# Patient Record
Sex: Female | Born: 1945
Health system: Southern US, Community
[De-identification: ages and names within clinical notes are randomized; demographics above are authoritative.]

## PROBLEM LIST (undated history)

## (undated) DIAGNOSIS — R5383 Other fatigue: Secondary | ICD-10-CM

## (undated) DIAGNOSIS — J309 Allergic rhinitis, unspecified: Secondary | ICD-10-CM

## (undated) DIAGNOSIS — Z8679 Personal history of other diseases of the circulatory system: Secondary | ICD-10-CM

## (undated) DIAGNOSIS — F419 Anxiety disorder, unspecified: Secondary | ICD-10-CM

## (undated) DIAGNOSIS — E7800 Pure hypercholesterolemia, unspecified: Secondary | ICD-10-CM

## (undated) DIAGNOSIS — K0510 Chronic gingivitis, plaque induced: Secondary | ICD-10-CM

## (undated) DIAGNOSIS — R0602 Shortness of breath: Secondary | ICD-10-CM

## (undated) DIAGNOSIS — N951 Menopausal and female climacteric states: Secondary | ICD-10-CM

## (undated) DIAGNOSIS — G4700 Insomnia, unspecified: Secondary | ICD-10-CM

## (undated) DIAGNOSIS — E039 Hypothyroidism, unspecified: Secondary | ICD-10-CM

## (undated) DIAGNOSIS — I7300 Raynaud's syndrome without gangrene: Secondary | ICD-10-CM

## (undated) DIAGNOSIS — M533 Sacrococcygeal disorders, not elsewhere classified: Secondary | ICD-10-CM

## (undated) DIAGNOSIS — E559 Vitamin D deficiency, unspecified: Secondary | ICD-10-CM

## (undated) DIAGNOSIS — Z91038 Other insect allergy status: Secondary | ICD-10-CM

## (undated) DIAGNOSIS — Z6823 Body mass index (BMI) 23.0-23.9, adult: Secondary | ICD-10-CM

## (undated) DIAGNOSIS — E785 Hyperlipidemia, unspecified: Secondary | ICD-10-CM

## (undated) DIAGNOSIS — N952 Postmenopausal atrophic vaginitis: Secondary | ICD-10-CM

## (undated) DIAGNOSIS — I10 Essential (primary) hypertension: Secondary | ICD-10-CM

## (undated) DIAGNOSIS — Z91030 Bee allergy status: Secondary | ICD-10-CM

## (undated) DIAGNOSIS — Z889 Allergy status to unspecified drugs, medicaments and biological substances status: Secondary | ICD-10-CM

## (undated) HISTORY — DX: Essential (primary) hypertension: I10

## (undated) HISTORY — DX: Chronic gingivitis, plaque induced: K05.10

## (undated) HISTORY — DX: Other fatigue: R53.83

## (undated) HISTORY — DX: Vitamin D deficiency, unspecified: E55.9

## (undated) HISTORY — DX: Hypothyroidism, unspecified: E03.9

## (undated) HISTORY — DX: Pure hypercholesterolemia, unspecified: E78.00

## (undated) HISTORY — DX: Allergic rhinitis, unspecified: J30.9

## (undated) HISTORY — PX: ADENOIDECTOMY: SUR15

## (undated) HISTORY — DX: Hyperlipidemia, unspecified: E78.5

## (undated) HISTORY — DX: Shortness of breath: R06.02

## (undated) HISTORY — DX: Other insect allergy status: Z91.038

## (undated) HISTORY — DX: Allergy status to unspecified drugs, medicaments and biological substances: Z88.9

## (undated) HISTORY — DX: Menopausal and female climacteric states: N95.1

## (undated) HISTORY — PX: ABDOMINAL HYSTERECTOMY: SUR658

## (undated) HISTORY — PX: OTHER SURGICAL HISTORY: SHX169

## (undated) HISTORY — DX: Insomnia, unspecified: G47.00

## (undated) HISTORY — PX: REFRACTIVE SURGERY: SHX103

## (undated) HISTORY — DX: Body mass index (BMI) 23.0-23.9, adult: Z68.23

## (undated) HISTORY — DX: Postmenopausal atrophic vaginitis: N95.2

## (undated) HISTORY — DX: Personal history of other diseases of the circulatory system: Z86.79

## (undated) HISTORY — DX: Raynaud's syndrome without gangrene: I73.00

## (undated) HISTORY — DX: Sacrococcygeal disorders, not elsewhere classified: M53.3

## (undated) HISTORY — DX: Anxiety disorder, unspecified: F41.9

## (undated) HISTORY — PX: LAPAROSCOPY: SHX197

## (undated) HISTORY — PX: TONSILLECTOMY: SUR1361

---
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
## 1998-09-10 ENCOUNTER — Ambulatory Visit (HOSPITAL_COMMUNITY): Admission: RE | Admit: 1998-09-10 | Discharge: 1998-09-10 | Payer: Self-pay | Admitting: Orthopedic Surgery

## 1998-09-10 ENCOUNTER — Encounter: Payer: Self-pay | Admitting: Orthopedic Surgery

## 1998-11-05 ENCOUNTER — Ambulatory Visit (HOSPITAL_BASED_OUTPATIENT_CLINIC_OR_DEPARTMENT_OTHER): Admission: RE | Admit: 1998-11-05 | Discharge: 1998-11-05 | Payer: Self-pay | Admitting: Orthopedic Surgery

## 1999-01-16 ENCOUNTER — Ambulatory Visit (HOSPITAL_COMMUNITY): Admission: RE | Admit: 1999-01-16 | Discharge: 1999-01-16 | Payer: Self-pay

## 1999-05-12 ENCOUNTER — Other Ambulatory Visit: Admission: RE | Admit: 1999-05-12 | Discharge: 1999-05-12 | Payer: Self-pay | Admitting: Obstetrics and Gynecology

## 2000-06-06 ENCOUNTER — Other Ambulatory Visit: Admission: RE | Admit: 2000-06-06 | Discharge: 2000-06-06 | Payer: Self-pay | Admitting: Obstetrics and Gynecology

## 2001-09-27 ENCOUNTER — Encounter: Admission: RE | Admit: 2001-09-27 | Discharge: 2001-09-27 | Payer: Self-pay | Admitting: Surgery

## 2001-09-27 ENCOUNTER — Encounter: Payer: Self-pay | Admitting: Surgery

## 2002-03-28 ENCOUNTER — Encounter: Payer: Self-pay | Admitting: Internal Medicine

## 2002-03-28 ENCOUNTER — Encounter: Admission: RE | Admit: 2002-03-28 | Discharge: 2002-03-28 | Payer: Self-pay | Admitting: Internal Medicine

## 2002-12-26 ENCOUNTER — Encounter: Payer: Self-pay | Admitting: Internal Medicine

## 2002-12-26 ENCOUNTER — Encounter: Admission: RE | Admit: 2002-12-26 | Discharge: 2002-12-26 | Payer: Self-pay | Admitting: Internal Medicine

## 2005-12-08 ENCOUNTER — Other Ambulatory Visit: Admission: RE | Admit: 2005-12-08 | Discharge: 2005-12-08 | Payer: Self-pay | Admitting: Anesthesiology

## 2017-03-28 DIAGNOSIS — N952 Postmenopausal atrophic vaginitis: Secondary | ICD-10-CM | POA: Diagnosis not present

## 2017-03-28 DIAGNOSIS — Z6822 Body mass index (BMI) 22.0-22.9, adult: Secondary | ICD-10-CM | POA: Diagnosis not present

## 2017-03-28 DIAGNOSIS — E785 Hyperlipidemia, unspecified: Secondary | ICD-10-CM | POA: Diagnosis not present

## 2017-03-28 DIAGNOSIS — Z79899 Other long term (current) drug therapy: Secondary | ICD-10-CM | POA: Diagnosis not present

## 2017-03-28 DIAGNOSIS — J309 Allergic rhinitis, unspecified: Secondary | ICD-10-CM | POA: Diagnosis not present

## 2017-03-28 DIAGNOSIS — E039 Hypothyroidism, unspecified: Secondary | ICD-10-CM | POA: Diagnosis not present

## 2017-03-28 DIAGNOSIS — N3281 Overactive bladder: Secondary | ICD-10-CM | POA: Diagnosis not present

## 2017-03-28 DIAGNOSIS — Z9181 History of falling: Secondary | ICD-10-CM | POA: Diagnosis not present

## 2017-03-28 DIAGNOSIS — Z1389 Encounter for screening for other disorder: Secondary | ICD-10-CM | POA: Diagnosis not present

## 2017-03-28 DIAGNOSIS — E559 Vitamin D deficiency, unspecified: Secondary | ICD-10-CM | POA: Diagnosis not present

## 2017-03-28 DIAGNOSIS — I10 Essential (primary) hypertension: Secondary | ICD-10-CM | POA: Diagnosis not present

## 2017-03-28 DIAGNOSIS — Z889 Allergy status to unspecified drugs, medicaments and biological substances status: Secondary | ICD-10-CM | POA: Diagnosis not present

## 2017-04-14 DIAGNOSIS — H43811 Vitreous degeneration, right eye: Secondary | ICD-10-CM | POA: Diagnosis not present

## 2017-04-17 DIAGNOSIS — S61012A Laceration without foreign body of left thumb without damage to nail, initial encounter: Secondary | ICD-10-CM | POA: Diagnosis not present

## 2017-04-24 DIAGNOSIS — H43813 Vitreous degeneration, bilateral: Secondary | ICD-10-CM | POA: Diagnosis not present

## 2017-11-22 DIAGNOSIS — E559 Vitamin D deficiency, unspecified: Secondary | ICD-10-CM | POA: Diagnosis not present

## 2017-11-22 DIAGNOSIS — Z6824 Body mass index (BMI) 24.0-24.9, adult: Secondary | ICD-10-CM | POA: Diagnosis not present

## 2017-11-22 DIAGNOSIS — F419 Anxiety disorder, unspecified: Secondary | ICD-10-CM | POA: Diagnosis not present

## 2017-11-22 DIAGNOSIS — E785 Hyperlipidemia, unspecified: Secondary | ICD-10-CM | POA: Diagnosis not present

## 2017-11-22 DIAGNOSIS — Z79899 Other long term (current) drug therapy: Secondary | ICD-10-CM | POA: Diagnosis not present

## 2017-11-22 DIAGNOSIS — I10 Essential (primary) hypertension: Secondary | ICD-10-CM | POA: Diagnosis not present

## 2017-11-22 DIAGNOSIS — N959 Unspecified menopausal and perimenopausal disorder: Secondary | ICD-10-CM | POA: Diagnosis not present

## 2017-11-22 DIAGNOSIS — E039 Hypothyroidism, unspecified: Secondary | ICD-10-CM | POA: Diagnosis not present

## 2017-11-22 DIAGNOSIS — J309 Allergic rhinitis, unspecified: Secondary | ICD-10-CM | POA: Diagnosis not present

## 2017-12-13 DIAGNOSIS — I7300 Raynaud's syndrome without gangrene: Secondary | ICD-10-CM | POA: Insufficient documentation

## 2017-12-13 DIAGNOSIS — T691XXD Chilblains, subsequent encounter: Secondary | ICD-10-CM

## 2017-12-13 HISTORY — DX: Raynaud's syndrome without gangrene: I73.00

## 2017-12-13 HISTORY — DX: Chilblains, subsequent encounter: T69.1XXD

## 2018-04-09 DIAGNOSIS — F419 Anxiety disorder, unspecified: Secondary | ICD-10-CM | POA: Diagnosis not present

## 2018-04-09 DIAGNOSIS — Z1231 Encounter for screening mammogram for malignant neoplasm of breast: Secondary | ICD-10-CM | POA: Diagnosis not present

## 2018-04-09 DIAGNOSIS — Z6824 Body mass index (BMI) 24.0-24.9, adult: Secondary | ICD-10-CM | POA: Diagnosis not present

## 2018-04-09 DIAGNOSIS — M542 Cervicalgia: Secondary | ICD-10-CM | POA: Diagnosis not present

## 2018-05-03 DIAGNOSIS — H43813 Vitreous degeneration, bilateral: Secondary | ICD-10-CM | POA: Diagnosis not present

## 2018-06-25 DIAGNOSIS — Z6824 Body mass index (BMI) 24.0-24.9, adult: Secondary | ICD-10-CM | POA: Diagnosis not present

## 2018-06-25 DIAGNOSIS — E039 Hypothyroidism, unspecified: Secondary | ICD-10-CM | POA: Diagnosis not present

## 2018-06-25 DIAGNOSIS — I10 Essential (primary) hypertension: Secondary | ICD-10-CM | POA: Diagnosis not present

## 2018-06-25 DIAGNOSIS — Z79899 Other long term (current) drug therapy: Secondary | ICD-10-CM | POA: Diagnosis not present

## 2018-06-25 DIAGNOSIS — M533 Sacrococcygeal disorders, not elsewhere classified: Secondary | ICD-10-CM | POA: Diagnosis not present

## 2018-06-25 DIAGNOSIS — E559 Vitamin D deficiency, unspecified: Secondary | ICD-10-CM | POA: Diagnosis not present

## 2018-06-25 DIAGNOSIS — E785 Hyperlipidemia, unspecified: Secondary | ICD-10-CM | POA: Diagnosis not present

## 2018-09-30 DIAGNOSIS — M546 Pain in thoracic spine: Secondary | ICD-10-CM | POA: Diagnosis not present

## 2018-09-30 DIAGNOSIS — Z6824 Body mass index (BMI) 24.0-24.9, adult: Secondary | ICD-10-CM | POA: Diagnosis not present

## 2018-10-16 DIAGNOSIS — Z6823 Body mass index (BMI) 23.0-23.9, adult: Secondary | ICD-10-CM | POA: Diagnosis not present

## 2018-10-16 DIAGNOSIS — I10 Essential (primary) hypertension: Secondary | ICD-10-CM | POA: Diagnosis not present

## 2018-10-16 DIAGNOSIS — Z1231 Encounter for screening mammogram for malignant neoplasm of breast: Secondary | ICD-10-CM | POA: Diagnosis not present

## 2018-10-16 DIAGNOSIS — Z1331 Encounter for screening for depression: Secondary | ICD-10-CM | POA: Diagnosis not present

## 2018-10-16 DIAGNOSIS — Z9181 History of falling: Secondary | ICD-10-CM | POA: Diagnosis not present

## 2018-10-16 DIAGNOSIS — E785 Hyperlipidemia, unspecified: Secondary | ICD-10-CM | POA: Diagnosis not present

## 2018-10-23 DIAGNOSIS — M5431 Sciatica, right side: Secondary | ICD-10-CM | POA: Diagnosis not present

## 2018-10-23 DIAGNOSIS — M545 Low back pain: Secondary | ICD-10-CM | POA: Diagnosis not present

## 2019-03-27 DIAGNOSIS — I10 Essential (primary) hypertension: Secondary | ICD-10-CM | POA: Diagnosis not present

## 2019-03-27 DIAGNOSIS — E785 Hyperlipidemia, unspecified: Secondary | ICD-10-CM | POA: Diagnosis not present

## 2019-03-27 DIAGNOSIS — G4700 Insomnia, unspecified: Secondary | ICD-10-CM | POA: Diagnosis not present

## 2019-03-27 DIAGNOSIS — M533 Sacrococcygeal disorders, not elsewhere classified: Secondary | ICD-10-CM | POA: Diagnosis not present

## 2019-03-28 DIAGNOSIS — E785 Hyperlipidemia, unspecified: Secondary | ICD-10-CM | POA: Diagnosis not present

## 2019-03-28 DIAGNOSIS — E039 Hypothyroidism, unspecified: Secondary | ICD-10-CM | POA: Diagnosis not present

## 2019-03-28 DIAGNOSIS — E559 Vitamin D deficiency, unspecified: Secondary | ICD-10-CM | POA: Diagnosis not present

## 2019-03-28 DIAGNOSIS — Z79899 Other long term (current) drug therapy: Secondary | ICD-10-CM | POA: Diagnosis not present

## 2019-05-12 DIAGNOSIS — Z889 Allergy status to unspecified drugs, medicaments and biological substances status: Secondary | ICD-10-CM | POA: Diagnosis not present

## 2019-05-12 DIAGNOSIS — G4700 Insomnia, unspecified: Secondary | ICD-10-CM | POA: Diagnosis not present

## 2019-05-12 DIAGNOSIS — Z6824 Body mass index (BMI) 24.0-24.9, adult: Secondary | ICD-10-CM | POA: Diagnosis not present

## 2019-05-12 DIAGNOSIS — Z91030 Bee allergy status: Secondary | ICD-10-CM | POA: Diagnosis not present

## 2019-06-02 ENCOUNTER — Ambulatory Visit: Payer: Self-pay | Admitting: Allergy and Immunology

## 2019-06-09 ENCOUNTER — Other Ambulatory Visit: Payer: Self-pay

## 2019-06-09 ENCOUNTER — Ambulatory Visit (INDEPENDENT_AMBULATORY_CARE_PROVIDER_SITE_OTHER): Payer: Medicare Other | Admitting: Allergy and Immunology

## 2019-06-09 ENCOUNTER — Encounter: Payer: Self-pay | Admitting: Allergy and Immunology

## 2019-06-09 VITALS — BP 112/72 | HR 68 | Temp 98.3°F | Resp 14 | Ht 62.5 in | Wt 138.6 lb

## 2019-06-09 DIAGNOSIS — K219 Gastro-esophageal reflux disease without esophagitis: Secondary | ICD-10-CM | POA: Diagnosis not present

## 2019-06-09 DIAGNOSIS — J3089 Other allergic rhinitis: Secondary | ICD-10-CM | POA: Diagnosis not present

## 2019-06-09 DIAGNOSIS — T782XXD Anaphylactic shock, unspecified, subsequent encounter: Secondary | ICD-10-CM

## 2019-06-09 DIAGNOSIS — H6981 Other specified disorders of Eustachian tube, right ear: Secondary | ICD-10-CM | POA: Diagnosis not present

## 2019-06-09 DIAGNOSIS — T63481D Toxic effect of venom of other arthropod, accidental (unintentional), subsequent encounter: Secondary | ICD-10-CM

## 2019-06-09 MED ORDER — OMEPRAZOLE 40 MG PO CPDR: 40.0000 mg | DELAYED_RELEASE_CAPSULE | ORAL | 5 refills | Status: DC

## 2019-06-09 MED ORDER — FAMOTIDINE 40 MG PO TABS: 40.0000 mg | ORAL_TABLET | Freq: Every day | ORAL | 5 refills | Status: DC

## 2019-06-09 MED ORDER — EPINEPHRINE 0.3 MG/0.3ML IJ SOAJ: INTRAMUSCULAR | 3 refills | Status: DC

## 2019-06-09 NOTE — Patient Instructions (Addendum)
  1.  Allergen avoidance measures?  2.  Blood -fire ant IgE, hymenoptera venom panel, tryptase  3.  EpiPen, Benadryl, MD/ER evaluation for allergic reaction  4.  Continue to treat allergies and ear issue:   A.  Montelukast 10 mg - 1 tablet daily  B.  Nasal fluticasone - 1-2 sprays each nostril daily  C.  OTC antihistamine if needed  5.  Treat LPR:   A.  Slowly consolidate caffeine consumption  B.  Omeprazole 40 mg tablet in a.m.  C.  Famotidine 40 mg tablet in p.m.  D.  Replace throat clearing with swallowing maneuver  6.  Immunotherapy?  7.  Visit with ENT?  8.  Return to clinic in 4 weeks or earlier if problem

## 2019-06-09 NOTE — Progress Notes (Signed)
[place]   Dear Dr. Mathis BudUppin,  Thank you for referring Allison Zavala to the [facility] [place] on 06/09/2019.   Below is a summation of this patient's evaluation and recommendations.  Thank you for your referral. I will keep you informed about this patient's response to treatment.   If you have any questions please do not hesitate to contact me.   Sincerely,  Jessica PriestEric J. Abisai Coble, MD Allergy / Immunology [facility]    ______________________________________________________________________    NEW PATIENT NOTE  Referring Provider: Lucianne LeiUppin, Nina, MD Primary Provider: Lucianne LeiUppin, Nina, MD Date of office visit: 06/09/2019    Subjective:   Chief Complaint:  Allison JimSherre E Couse (DOB: 1946-05-13) is a 73 y.o. female who presents to the clinic on 06/09/2019 with a chief complaint of Allergic Reaction .     HPI: Allison Zavala presents to this clinic in evaluation of several issues.  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  She had a very interesting history of recurrent reactions.  Apparently after being stung by multiple hymenoptera and having her anaphylactic reaction as a result of that exposure she had intermittent anaphylaxis secondary to exposures unrelated to hymenoptera venom.  Apparently this went on for about 2 years or so.  She [facility] evaluation [facility] and apparently her diagnostic evaluation only identified hymenoptera venom hypersensitivity state.  Fortunately, after about 2 years of increased immune system activity she resolve this issue of recurrent anaphylaxis and for 7 years did not have any issues.  Second, 6 weeks ago she was stung multiple times by some identifiable insect under her left arm while working in the  garden.  She noticed red spots and pain and she went inside and took Benadryl.  This was around 3 PM in the afternoon.  At around 11 PM she developed global pruritus and throat constriction and facial swelling for which she used an EpiPen which resolved this issue within about 60 minutes.  She had no associated GI or nasal symptoms and did not have any hives.  Third, she has a history of right ear fullness and achiness and some nasal congestion for which he uses montelukast and Flonase which helps this issue significantly.  Fourth, she has constant throat clearing for the past year or two.  She must take NyQuil at night to stop her throat clearing and this is not very effective in alleviating this issue.  Montelukast and Flonase does not help this issue.  She throat clears because she feels something stuck in her throat.  She does have intermittent regurgitation and heartburn for which she will take Tums about once a week or once every 2 weeks.  She does drink 12 cups of very strong coffee per day.  Past Medical History:  Diagnosis Date  . High cholesterol   . Hypothyroidism     Past Surgical History:  Procedure Laterality Date  . ABDOMINAL HYSTERECTOMY    . ADENOIDECTOMY    . LAPAROSCOPY     X 3  . OTHER SURGICAL HISTORY     Pelvic repairs abdominal X 2  . REFRACTIVE SURGERY    . TONSILLECTOMY      Allergies as of 06/09/2019      Reactions   Demerol [meperidine Hcl] Nausea And Vomiting  Medication List      atorvastatin 80 MG tablet Commonly known as: LIPITOR TK 1 T PO HS   EPINEPHrine 0.3 mg/0.3 mL Soaj injection Commonly known as: EPI-PEN INJECT INTRAMUSCULARLY AS DIRECTED   estradiol 0.5 MG tablet Commonly known as: ESTRACE TK 1 T PO QD   fluticasone 50 MCG/ACT nasal spray Commonly known as: FLONASE USE 2 SPRAYS IN EACH NOSTRIL EVERY DAY   levothyroxine 75 MCG tablet Commonly known as: SYNTHROID TK 1 T PO D   montelukast 10 MG tablet Commonly known as:  SINGULAIR TK 1 T PO QD   sertraline 100 MG tablet Commonly known as: ZOLOFT TK 1 T PO QD       Review of systems negative except as noted in HPI / PMHx or noted below:  Review of Systems  Constitutional: Negative.   HENT: Negative.   Eyes: Negative.   Respiratory: Negative.   Cardiovascular: Negative.   Gastrointestinal: Negative.   Genitourinary: Negative.   Musculoskeletal: Negative.   Skin: Negative.   Neurological: Negative.   Endo/Heme/Allergies: Negative.   Psychiatric/Behavioral: Negative.     Family History  Problem Relation Age of Onset  . Stroke Mother   . Alzheimer's disease Mother   . Lung cancer Father     Social History   Socioeconomic History  . Marital status: Divorced    Spouse name: Not on file  . Number of children: Not on file  . Years of education: Not on file  . Highest education level: Not on file  Occupational History  . Not on file  Social Needs  . Financial resource strain: Not on file  . Food insecurity    Worry: Not on file    Inability: Not on file  . Transportation needs    Medical: Not on file    Non-medical: Not on file  Tobacco Use  . Smoking status: Never Smoker  . Smokeless tobacco: Never Used  Substance and Sexual Activity  . Alcohol use: Yes  . Drug use: Never  . Sexual activity: Not on file  Lifestyle  . Physical activity    Days per week: Not on file    Minutes per session: Not on file  . Stress: Not on file  Relationships  . Social Musicianconnections    Talks on phone: Not on file    Gets together: Not on file    Attends religious service: Not on file    Active member of club or organization: Not on file    Attends meetings of clubs or organizations: Not on file    Relationship status: Not on file  . Intimate partner violence    Fear of current or ex partner: Not on file    Emotionally abused: Not on file    Physically abused: Not on file    Forced sexual activity: Not on file  Other Topics Concern  . Not  on file  Social History Narrative  . Not on file    Environmental and Social history  Lives in a house with a dry environment, a dog located inside the household, wood in the bedroom, plastic on the bed, plastic on the pillow, no smokers located to the household.  She is an Network engineerinterior designer.  Objective:   Vitals:   06/09/19 1348  BP: 112/72  Pulse: 68  Resp: 14  Temp: 98.3 F (36.8 C)  SpO2: 96%   Height: 5' 2.5" (158.8 cm) Weight: 138 lb 9.6 oz (62.9 kg)  Physical Exam Constitutional:  Appearance: She is not diaphoretic.  HENT:     Head: Normocephalic. No right periorbital erythema or left periorbital erythema.     Right Ear: Ear canal and external ear normal. There is impacted cerumen.     Left Ear: Ear canal and external ear normal. There is impacted cerumen.     Nose: Nose normal. No mucosal edema or rhinorrhea.     Mouth/Throat:     Pharynx: Uvula midline. No oropharyngeal exudate.  Eyes:     General: Lids are normal.     Conjunctiva/sclera: Conjunctivae normal.     Pupils: Pupils are equal, round, and reactive to light.  Neck:     Thyroid: No thyromegaly.     Trachea: Trachea normal. No tracheal tenderness or tracheal deviation.  Cardiovascular:     Rate and Rhythm: Normal rate and regular rhythm.     Heart sounds: Normal heart sounds, S1 normal and S2 normal. No murmur.  Pulmonary:     Effort: Pulmonary effort is normal. No respiratory distress.     Breath sounds: Normal breath sounds. No stridor. No wheezing or rales.  Chest:     Chest wall: No tenderness.  Abdominal:     General: There is no distension.     Palpations: Abdomen is soft. There is no mass.     Tenderness: There is no abdominal tenderness. There is no guarding or rebound.  Musculoskeletal:        General: No tenderness.  Lymphadenopathy:     Head:     Right side of head: No tonsillar adenopathy.     Left side of head: No tonsillar adenopathy.     Cervical: No cervical adenopathy.   Skin:    Coloration: Skin is not pale.     Findings: No erythema or rash.     Nails: There is no clubbing.   Neurological:     Mental Status: She is alert.     Diagnostics: Allergy skin tests were not performed.   Assessment and Plan:    1. Anaphylaxis due to hymenoptera venom, accidental or unintentional, subsequent encounter   2. Perennial allergic rhinitis   3. ETD (Eustachian tube dysfunction), right   4. LPRD (laryngopharyngeal reflux disease)     1.  Allergen avoidance measures?  2.  Blood -fire ant IgE, hymenoptera venom panel, tryptase  3.  EpiPen, Benadryl, MD/ER evaluation for allergic reaction  4.  Continue to treat allergies and ear issue:   A.  Montelukast 10 mg - 1 tablet daily  B.  Nasal fluticasone - 1-2 sprays each nostril daily  C.  OTC antihistamine if needed  5.  Treat LPR:   A.  Slowly consolidate caffeine consumption  B.  Omeprazole 40 mg tablet in a.m.  C.  Famotidine 40 mg tablet in p.m.  D.  Replace throat clearing with swallowing maneuver  6.  Immunotherapy?  7.  Visit with ENT?  8.  Return to clinic in 4 weeks or earlier if problem  Hanadi will obtain blood tests in investigation of her recent episode anaphylaxis which may have been precipitated by some form of hymenoptera venom exposure.  As well, given her history of atypical anaphylaxis with unknown trigger that appeared to be a significant issue years ago we will check a tryptase level to see if she has a problem with mast cell burden.  Although her montelukast and Flonase has helped her ETD and allergies it has not helped her constant throat clearing which is probably a  manifestation of LPR.  We will give her a 4-week trial on the therapy noted above to address this issue.  If this does not improve with this treatment then we need to have her throat evaluated by ENT.  I will contact her with the results of her blood test once they are available for review.  Jiles Prows, MD Allergy /  Immunology [facility]

## 2019-06-10 ENCOUNTER — Encounter: Payer: Self-pay | Admitting: Allergy and Immunology

## 2019-06-13 LAB — ALLERGEN HYMENOPTERA PANEL
Bumblebee: <0.1 kU/L
Honeybee IgE: 0.1 kU/L — AB
Hornet, White Face, IgE: 3 kU/L — AB
Hornet, Yellow, IgE: 2.32 kU/L — AB
Paper Wasp IgE: 33 kU/L — AB
Yellow Jacket, IgE: 12.6 kU/L — AB

## 2019-06-13 LAB — ALLERGEN FIRE ANT: I070-IgE Fire Ant (Invicta): 0.2 kU/L — AB

## 2019-06-13 LAB — TRYPTASE: Tryptase: 6.8 ug/L (ref 2.2–13.2)

## 2019-06-26 DIAGNOSIS — K219 Gastro-esophageal reflux disease without esophagitis: Secondary | ICD-10-CM | POA: Diagnosis not present

## 2019-06-26 DIAGNOSIS — J309 Allergic rhinitis, unspecified: Secondary | ICD-10-CM | POA: Diagnosis not present

## 2019-06-26 DIAGNOSIS — Z6824 Body mass index (BMI) 24.0-24.9, adult: Secondary | ICD-10-CM | POA: Diagnosis not present

## 2019-06-26 DIAGNOSIS — G4700 Insomnia, unspecified: Secondary | ICD-10-CM | POA: Diagnosis not present

## 2019-07-16 ENCOUNTER — Encounter: Payer: Self-pay | Admitting: Allergy and Immunology

## 2019-07-16 ENCOUNTER — Other Ambulatory Visit: Payer: Self-pay

## 2019-07-16 ENCOUNTER — Ambulatory Visit (INDEPENDENT_AMBULATORY_CARE_PROVIDER_SITE_OTHER): Payer: Medicare Other | Admitting: Allergy and Immunology

## 2019-07-16 VITALS — BP 132/84 | HR 63 | Temp 98.4°F | Resp 16

## 2019-07-16 DIAGNOSIS — T782XXD Anaphylactic shock, unspecified, subsequent encounter: Secondary | ICD-10-CM | POA: Diagnosis not present

## 2019-07-16 DIAGNOSIS — K219 Gastro-esophageal reflux disease without esophagitis: Secondary | ICD-10-CM | POA: Diagnosis not present

## 2019-07-16 DIAGNOSIS — H6981 Other specified disorders of Eustachian tube, right ear: Secondary | ICD-10-CM | POA: Diagnosis not present

## 2019-07-16 DIAGNOSIS — J3089 Other allergic rhinitis: Secondary | ICD-10-CM | POA: Diagnosis not present

## 2019-07-16 DIAGNOSIS — T63481D Toxic effect of venom of other arthropod, accidental (unintentional), subsequent encounter: Secondary | ICD-10-CM | POA: Diagnosis not present

## 2019-07-16 NOTE — Patient Instructions (Addendum)
  1.  Consider a course of immunotherapy directed against hymenoptera venom  2. EpiPen, Benadryl, MD/ER evaluation for allergic reaction  3.  Continue to treat allergies and ear issue:   A.  Montelukast 10 mg - 1 tablet daily  B.  Nasal fluticasone - 1-2 sprays each nostril daily  C.  OTC antihistamine if needed  4.  Treat LPR:   A.  Slowly consolidate caffeine consumption  B.  Omeprazole 40 mg tablet in a.m.  C.  Famotidine 40 mg tablet in p.m.  D.  Replace throat clearing with swallowing maneuver  5.  Visit with ENT for ETD  6.  Return to clinic in 8 weeks or earlier if problem  7. Obtain fall flu vaccine (and COVID vaccine)

## 2019-07-16 NOTE — Progress Notes (Signed)
[name]   Follow-up Note  Referring Provider: Lucianne LeiUppin, Nina, MD Primary Provider: Lucianne LeiUppin, Nina, MD Date of Office Visit: 07/16/2019  Subjective:   Allison Zavala (DOB: Feb 12, 1946) is a 73 y.o. female who [facility] 07[facility]/2019 in re-evaluation of the following:  HPI: [name] returns to this clinic in evaluation of allergic rhinitis, ETD, LPR, and history of hymenoptera venom hypersensitivity state.  I last saw her in this clinic during her initial evaluation of 09 June 2019.  She has significantly improved regarding all of her throat issues.  She is 85% improved regarding her constant throat clearing and feeling as though something is in her throat.  All of her heartburn has resolved.  She is down to drinking 6 cups of half caffeinated coffee per day and has been consistently using her PPI and H2 receptor blocker.  Her nose is doing quite well at this point in time on a nasal steroid and montelukast.  She still has ear fullness especially on the right.  There is really been no change regarding this issue.  She carries an EpiPen for her hymenoptera venom hypersensitivity state.  Allergies as of 07/16/2019      Reactions   Demerol [meperidine Hcl] Nausea And Vomiting      Medication List      atorvastatin 80 MG tablet Commonly known as: LIPITOR TK 1 T PO HS   baclofen 10 MG tablet Commonly known as: LIORESAL TK 1 T PO D PRN   EPINEPHrine 0.3 mg/0.3 mL Soaj injection Commonly known as: EPI-PEN Use as directed for life-threatening allergic reaction.   estradiol 0.5 MG tablet Commonly known as: ESTRACE TK 1 T PO QD   famotidine 40 MG tablet Commonly known as: PEPCID Take 1 tablet (40 mg total) by mouth at bedtime.   fluticasone 50 MCG/ACT nasal spray Commonly known as: FLONASE USE 2 SPRAYS IN EACH NOSTRIL EVERY DAY   gabapentin 100 MG capsule Commonly known as: NEURONTIN TK 1 C PO HS  FOR 1 WEEK THEN 1 C PO BID THEN TID   levothyroxine 75 MCG tablet Commonly known as: SYNTHROID TK 1 T PO D   montelukast 10 MG tablet Commonly known as: SINGULAIR TK 1 T PO QD   omeprazole 40 MG capsule Commonly known as: PRILOSEC Take 1 capsule (40 mg total) by mouth every morning.   sertraline 100 MG tablet Commonly known as: ZOLOFT TK 1 T PO QD   zolpidem 10 MG tablet Commonly known as: AMBIEN TK 1 T PO HS       Past Medical History:  Diagnosis Date  . High cholesterol   . Hypothyroidism     Past Surgical History:  Procedure Laterality Date  . ABDOMINAL HYSTERECTOMY    . ADENOIDECTOMY    . LAPAROSCOPY     X 3  . OTHER SURGICAL HISTORY     Pelvic repairs abdominal X 2  . REFRACTIVE SURGERY    . TONSILLECTOMY      Review of systems negative except as noted in HPI / PMHx or noted below:  Review of Systems  Constitutional: Negative.   HENT: Negative.   Eyes: Negative.   Respiratory: Negative.   Cardiovascular: Negative.   Gastrointestinal: Negative.   Genitourinary: Negative.   Musculoskeletal: Negative.   Skin: Negative.   Neurological: Negative.   Endo/Heme/Allergies: Negative.   Psychiatric/Behavioral: Negative.      Objective:   Vitals:  07/16/19 1128  BP: 132/84  Pulse: 63  Resp: 16  Temp: 98.4 F (36.9 C)  SpO2: 98%          Physical Exam Constitutional:      Appearance: She is not diaphoretic.  HENT:     Head: Normocephalic.     Right Ear: Tympanic membrane, ear canal and external ear normal.     Left Ear: Tympanic membrane, ear canal and external ear normal.     Nose: Nose normal. No mucosal edema or rhinorrhea.     Mouth/Throat:     Pharynx: Uvula midline. No oropharyngeal exudate.  Eyes:     Conjunctiva/sclera: Conjunctivae normal.  Neck:     Thyroid: No thyromegaly.     Trachea: Trachea normal. No tracheal tenderness or tracheal deviation.  Cardiovascular:     Rate and Rhythm: Normal rate and regular rhythm.      Heart sounds: Normal heart sounds, S1 normal and S2 normal. No murmur.  Pulmonary:     Effort: No respiratory distress.     Breath sounds: Normal breath sounds. No stridor. No wheezing or rales.  Lymphadenopathy:     Head:     Right side of head: No tonsillar adenopathy.     Left side of head: No tonsillar adenopathy.     Cervical: No cervical adenopathy.  Skin:    Findings: No erythema or rash.     Nails: There is no clubbing.   Neurological:     Mental Status: She is alert.     Diagnostics:    Results of blood tests obtained 17 June 2019 identified fire ant IgE 0.20 KU/L, white faced hornet 3.0 KU/L, yellowjacket 12.60 KU/L, wasp 33.0 KU/L, yellow faced hornet 2.32 KU/L, honeybee 0.10 KU/L  Assessment and Plan:   1. Perennial allergic rhinitis   2. ETD (Eustachian tube dysfunction), right   3. LPRD (laryngopharyngeal reflux disease)   4. Anaphylaxis due to hymenoptera venom, accidental or unintentional, subsequent encounter     1.  Consider a course of immunotherapy directed against hymenoptera venom  2. EpiPen, Benadryl, MD/ER evaluation for allergic reaction  3.  Continue to treat allergies and ear issue:   A.  Montelukast 10 mg - 1 tablet daily  B.  Nasal fluticasone - 1-2 sprays each nostril daily  C.  OTC antihistamine if needed  4.  Treat LPR:   A.  Slowly consolidate caffeine consumption  B.  Omeprazole 40 mg tablet in a.m.  C.  Famotidine 40 mg tablet in p.m.  D.  Replace throat clearing with swallowing maneuver  5.  Visit with ENT for ETD  6.  Return to clinic in 8 weeks or earlier if problem  7. Obtain fall flu vaccine (and COVID vaccine)  [name] seems to be better regarding her airway issue which appeared to be a combination of inflammation and reflux on her current plan and she will remain on this plan for a total of 12 weeks and thus I will see her back in this clinic in 8 weeks.  Her ETD is still a problem and will refer her onto ENT for this  issue.  We had a talk with her today about the role of immunotherapy in treating her hymenoptera venom hypersensitivity state and she is presently considering this option of therapy.  [name], MD Allergy / Immunology [place]

## 2019-07-17 ENCOUNTER — Encounter: Payer: Self-pay | Admitting: Allergy and Immunology

## 2019-07-21 ENCOUNTER — Other Ambulatory Visit: Payer: Self-pay | Admitting: Allergy and Immunology

## 2019-08-13 DIAGNOSIS — H9190 Unspecified hearing loss, unspecified ear: Secondary | ICD-10-CM | POA: Diagnosis not present

## 2019-08-13 DIAGNOSIS — K219 Gastro-esophageal reflux disease without esophagitis: Secondary | ICD-10-CM | POA: Diagnosis not present

## 2019-08-13 DIAGNOSIS — Z889 Allergy status to unspecified drugs, medicaments and biological substances status: Secondary | ICD-10-CM | POA: Diagnosis not present

## 2019-08-13 DIAGNOSIS — H61303 Acquired stenosis of external ear canal, unspecified, bilateral: Secondary | ICD-10-CM | POA: Diagnosis not present

## 2019-08-13 DIAGNOSIS — H938X1 Other specified disorders of right ear: Secondary | ICD-10-CM | POA: Diagnosis not present

## 2019-08-25 DIAGNOSIS — Z889 Allergy status to unspecified drugs, medicaments and biological substances status: Secondary | ICD-10-CM | POA: Diagnosis not present

## 2019-08-25 DIAGNOSIS — N952 Postmenopausal atrophic vaginitis: Secondary | ICD-10-CM | POA: Diagnosis not present

## 2019-08-25 DIAGNOSIS — H938X9 Other specified disorders of ear, unspecified ear: Secondary | ICD-10-CM | POA: Diagnosis not present

## 2019-08-25 DIAGNOSIS — H903 Sensorineural hearing loss, bilateral: Secondary | ICD-10-CM | POA: Diagnosis not present

## 2019-08-25 DIAGNOSIS — H68003 Unspecified Eustachian salpingitis, bilateral: Secondary | ICD-10-CM | POA: Diagnosis not present

## 2019-08-25 DIAGNOSIS — Z23 Encounter for immunization: Secondary | ICD-10-CM | POA: Diagnosis not present

## 2019-08-25 DIAGNOSIS — J309 Allergic rhinitis, unspecified: Secondary | ICD-10-CM | POA: Diagnosis not present

## 2019-08-25 DIAGNOSIS — H61303 Acquired stenosis of external ear canal, unspecified, bilateral: Secondary | ICD-10-CM | POA: Diagnosis not present

## 2019-09-10 ENCOUNTER — Ambulatory Visit (INDEPENDENT_AMBULATORY_CARE_PROVIDER_SITE_OTHER): Payer: Medicare Other | Admitting: Allergy and Immunology

## 2019-09-10 ENCOUNTER — Other Ambulatory Visit: Payer: Self-pay

## 2019-09-10 ENCOUNTER — Encounter: Payer: Self-pay | Admitting: Allergy and Immunology

## 2019-09-10 ENCOUNTER — Encounter (INDEPENDENT_AMBULATORY_CARE_PROVIDER_SITE_OTHER): Payer: Self-pay

## 2019-09-10 VITALS — BP 122/70 | HR 66 | Temp 97.8°F | Resp 16

## 2019-09-10 DIAGNOSIS — H6981 Other specified disorders of Eustachian tube, right ear: Secondary | ICD-10-CM

## 2019-09-10 DIAGNOSIS — T63481D Toxic effect of venom of other arthropod, accidental (unintentional), subsequent encounter: Secondary | ICD-10-CM

## 2019-09-10 DIAGNOSIS — T782XXD Anaphylactic shock, unspecified, subsequent encounter: Secondary | ICD-10-CM

## 2019-09-10 DIAGNOSIS — J3089 Other allergic rhinitis: Secondary | ICD-10-CM | POA: Diagnosis not present

## 2019-09-10 DIAGNOSIS — K219 Gastro-esophageal reflux disease without esophagitis: Secondary | ICD-10-CM | POA: Diagnosis not present

## 2019-09-10 NOTE — Patient Instructions (Addendum)
  1.  Consider a course of immunotherapy directed against hymenoptera venom  2. EpiPen, Benadryl, MD/ER evaluation for allergic reaction  3.  Continue to treat allergies and ear issue:   A.  Montelukast 10 mg - 1 tablet daily  B.  Nasal fluticasone - 1-2 sprays each nostril daily  4.  Continue to Treat LPR:   A.  Slowly consolidate caffeine consumption  B.  Omeprazole 40 mg tablet in a.m.  C.  Famotidine 40 mg tablet in p.m.  D.  Replace throat clearing with swallowing maneuver  5.  Can add OTC antihistamine-Claritin/Zyrtec/Allegra 1 time per day if needed  6.  Obtain Covid vaccine when available  7.  Return to clinic in 4 months or earlier if problem

## 2019-09-10 NOTE — Progress Notes (Signed)
Allison Zavala   Follow-up Note  Referring Provider: Nicholos Johns, MD Primary Provider: Nicholos Johns, MD Date of Office Visit: 09/10/2019  Subjective:   Allison Zavala (DOB: 09-24-1946) is a 73 y.o. female who returns to the [facility] on 09/10/2019 in re-evaluation of the following:  HPI: Allison Zavala returns to this clinic in evaluation of allergic rhinitis, ETD, LPR, and history of hymenoptera venom hypersensitivity state.  Her last visit to this clinic was 16 July 2019.  She has done very well regarding her LPR with a decrease in her throat clearing and her cough but she still occasionally has a cough and still has occasionally has some throat clearing.  Last time she was about 85% improved and she is at that level still.  She has decreased her caffeine consumption to 4 cups daily.  She continues on a leukotriene modifier and a nasal steroid and an H1 receptor blocker and a proton pump inhibitor.  She has had no classic reflux symptoms at this point in time.  She did visit with ENT regarding her ear fullness on the right and apparently there was not an option available from the ENT doctor to treat that issue.  He felt as though this was a defect in her ear that be permanent.  She continues to carry an EpiPen for her hymenoptera venom hypersensitivity state.  She did receive the flu vaccine.  Allergies as of 09/10/2019      Reactions   Demerol [meperidine Hcl] Nausea And Vomiting      Medication List      atorvastatin 80 MG tablet Commonly known as: LIPITOR TK 1 T PO HS   baclofen 10 MG tablet Commonly known as: LIORESAL TK 1 T PO D PRN   EPINEPHrine 0.3 mg/0.3 mL Soaj injection Commonly known as: EPI-PEN Use as directed for life-threatening allergic reaction.   estradiol 0.5 MG tablet Commonly known as: ESTRACE TK 1 T PO QD   famotidine 40 MG tablet Commonly known as: PEPCID Take 1 tablet (40 mg total) by  mouth at bedtime.   fluticasone 50 MCG/ACT nasal spray Commonly known as: FLONASE USE 2 SPRAYS IN EACH NOSTRIL EVERY DAY   gabapentin 100 MG capsule Commonly known as: NEURONTIN TK 1 C PO HS FOR 1 WEEK THEN 1 C PO BID THEN TID   levothyroxine 75 MCG tablet Commonly known as: SYNTHROID TK 1 T PO D   montelukast 10 MG tablet Commonly known as: SINGULAIR TK 1 T PO QD   omeprazole 40 MG capsule Commonly known as: PRILOSEC Take 1 capsule (40 mg total) by mouth every morning.   sertraline 100 MG tablet Commonly known as: ZOLOFT TK 1 T PO QD   zolpidem 10 MG tablet Commonly known as: AMBIEN TK 1 T PO HS       Past Medical History:  Diagnosis Date  . High cholesterol   . Hypothyroidism     Past Surgical History:  Procedure Laterality Date  . ABDOMINAL HYSTERECTOMY    . ADENOIDECTOMY    . LAPAROSCOPY     X 3  . OTHER SURGICAL HISTORY     Pelvic repairs abdominal X 2  . REFRACTIVE SURGERY    . TONSILLECTOMY      Review of systems negative except as noted in HPI / PMHx or noted below:  Review of Systems  Constitutional: Negative.   HENT: Negative.   Eyes: Negative.   Respiratory:  Negative.   Cardiovascular: Negative.   Gastrointestinal: Negative.   Genitourinary: Negative.   Musculoskeletal: Negative.   Skin: Negative.   Neurological: Negative.   Endo/Heme/Allergies: Negative.   Psychiatric/Behavioral: Negative.      Objective:   Vitals:   09/10/19 1134  BP: 122/70  Pulse: 66  Resp: 16  Temp: 97.8 F (36.6 C)  SpO2: 96%          Physical Exam Constitutional:      Appearance: She is not diaphoretic.  HENT:     Head: Normocephalic.     Right Ear: Tympanic membrane, ear canal and external ear normal.     Left Ear: Tympanic membrane, ear canal and external ear normal.     Nose: Nose normal. No mucosal edema or rhinorrhea.     Mouth/Throat:     Pharynx: Uvula midline. No oropharyngeal exudate.  Eyes:     Conjunctiva/sclera: Conjunctivae  normal.  Neck:     Thyroid: No thyromegaly.     Trachea: Trachea normal. No tracheal tenderness or tracheal deviation.  Cardiovascular:     Rate and Rhythm: Normal rate and regular rhythm.     Heart sounds: Normal heart sounds, S1 normal and S2 normal. No murmur.  Pulmonary:     Effort: No respiratory distress.     Breath sounds: Normal breath sounds. No stridor. No wheezing or rales.  Lymphadenopathy:     Head:     Right side of head: No tonsillar adenopathy.     Left side of head: No tonsillar adenopathy.     Cervical: No cervical adenopathy.  Skin:    Findings: No erythema or rash.     Nails: There is no clubbing.   Neurological:     Mental Status: She is alert.     Diagnostics: none  Assessment and Plan:   1. Perennial allergic rhinitis   2. LPRD (laryngopharyngeal reflux disease)   3. ETD (Eustachian tube dysfunction), right   4. Anaphylaxis due to hymenoptera venom, accidental or unintentional, subsequent encounter     1.  Consider a course of immunotherapy directed against hymenoptera venom  2. EpiPen, Benadryl, MD/ER evaluation for allergic reaction  3.  Continue to treat allergies and ear issue:   A.  Montelukast 10 mg - 1 tablet daily  B.  Nasal fluticasone - 1-2 sprays each nostril daily  4.  Continue to Treat LPR:   A.  Slowly consolidate caffeine consumption  B.  Omeprazole 40 mg tablet in a.m.  C.  Famotidine 40 mg tablet in p.m.  D.  Replace throat clearing with swallowing maneuver  5.  Can add OTC antihistamine-Claritin/Zyrtec/Allegra 1 time per day if needed  6.  Obtain Covid vaccine when available  7.  Return to clinic in 4 months or earlier if problem  Allison Zavala really appears to be doing relatively well at this point in time and I think that there is a possibility to consolidate some of her medical treatment when she returns to this clinic in 4 months if she continues to do well.  She will contact me during the interval should there be a  problem.  She is considering starting a course of immunotherapy against hymenoptera venom.  Allison Schimke, MD Allergy / Immunology [facility]

## 2019-09-11 ENCOUNTER — Encounter: Payer: Self-pay | Admitting: Allergy and Immunology

## 2019-10-02 DIAGNOSIS — S80862A Insect bite (nonvenomous), left lower leg, initial encounter: Secondary | ICD-10-CM | POA: Diagnosis not present

## 2019-10-02 DIAGNOSIS — Z79899 Other long term (current) drug therapy: Secondary | ICD-10-CM | POA: Diagnosis not present

## 2019-10-02 DIAGNOSIS — E039 Hypothyroidism, unspecified: Secondary | ICD-10-CM | POA: Diagnosis not present

## 2019-10-03 DIAGNOSIS — E039 Hypothyroidism, unspecified: Secondary | ICD-10-CM | POA: Diagnosis not present

## 2019-10-13 DIAGNOSIS — W57XXXA Bitten or stung by nonvenomous insect and other nonvenomous arthropods, initial encounter: Secondary | ICD-10-CM | POA: Diagnosis not present

## 2019-10-13 DIAGNOSIS — Z780 Asymptomatic menopausal state: Secondary | ICD-10-CM | POA: Diagnosis not present

## 2019-10-13 DIAGNOSIS — L089 Local infection of the skin and subcutaneous tissue, unspecified: Secondary | ICD-10-CM | POA: Diagnosis not present

## 2019-10-13 DIAGNOSIS — S80862A Insect bite (nonvenomous), left lower leg, initial encounter: Secondary | ICD-10-CM | POA: Diagnosis not present

## 2019-10-22 DIAGNOSIS — S81802A Unspecified open wound, left lower leg, initial encounter: Secondary | ICD-10-CM | POA: Diagnosis not present

## 2019-10-29 DIAGNOSIS — S81802A Unspecified open wound, left lower leg, initial encounter: Secondary | ICD-10-CM | POA: Diagnosis not present

## 2019-11-07 ENCOUNTER — Other Ambulatory Visit: Payer: Self-pay | Admitting: Allergy and Immunology

## 2019-11-25 DIAGNOSIS — S80862A Insect bite (nonvenomous), left lower leg, initial encounter: Secondary | ICD-10-CM | POA: Diagnosis not present

## 2019-11-25 DIAGNOSIS — Z1231 Encounter for screening mammogram for malignant neoplasm of breast: Secondary | ICD-10-CM | POA: Diagnosis not present

## 2019-11-28 DIAGNOSIS — R42 Dizziness and giddiness: Secondary | ICD-10-CM | POA: Diagnosis not present

## 2019-11-28 DIAGNOSIS — I10 Essential (primary) hypertension: Secondary | ICD-10-CM | POA: Diagnosis not present

## 2019-11-28 DIAGNOSIS — G4700 Insomnia, unspecified: Secondary | ICD-10-CM | POA: Diagnosis not present

## 2019-11-28 DIAGNOSIS — F419 Anxiety disorder, unspecified: Secondary | ICD-10-CM | POA: Diagnosis not present

## 2019-12-09 DIAGNOSIS — R200 Anesthesia of skin: Secondary | ICD-10-CM | POA: Diagnosis not present

## 2019-12-09 DIAGNOSIS — I7300 Raynaud's syndrome without gangrene: Secondary | ICD-10-CM | POA: Diagnosis not present

## 2019-12-09 DIAGNOSIS — R42 Dizziness and giddiness: Secondary | ICD-10-CM | POA: Diagnosis not present

## 2019-12-09 DIAGNOSIS — G44009 Cluster headache syndrome, unspecified, not intractable: Secondary | ICD-10-CM | POA: Diagnosis not present

## 2019-12-09 DIAGNOSIS — R519 Headache, unspecified: Secondary | ICD-10-CM | POA: Diagnosis not present

## 2020-01-12 ENCOUNTER — Ambulatory Visit: Payer: Medicare Other | Admitting: Allergy and Immunology

## 2020-01-15 DIAGNOSIS — Z889 Allergy status to unspecified drugs, medicaments and biological substances status: Secondary | ICD-10-CM | POA: Diagnosis not present

## 2020-01-15 DIAGNOSIS — J309 Allergic rhinitis, unspecified: Secondary | ICD-10-CM | POA: Diagnosis not present

## 2020-01-15 DIAGNOSIS — E039 Hypothyroidism, unspecified: Secondary | ICD-10-CM | POA: Diagnosis not present

## 2020-01-15 DIAGNOSIS — G4700 Insomnia, unspecified: Secondary | ICD-10-CM | POA: Diagnosis not present

## 2020-01-19 ENCOUNTER — Ambulatory Visit: Payer: Medicare Other | Admitting: Allergy and Immunology

## 2020-01-26 ENCOUNTER — Ambulatory Visit: Payer: Medicare Other | Admitting: Allergy and Immunology

## 2020-02-12 ENCOUNTER — Other Ambulatory Visit: Payer: Self-pay | Admitting: Allergy and Immunology

## 2020-02-18 ENCOUNTER — Encounter: Payer: Self-pay | Admitting: Allergy and Immunology

## 2020-02-18 ENCOUNTER — Ambulatory Visit (INDEPENDENT_AMBULATORY_CARE_PROVIDER_SITE_OTHER): Payer: Medicare Other | Admitting: Allergy and Immunology

## 2020-02-18 ENCOUNTER — Other Ambulatory Visit: Payer: Self-pay

## 2020-02-18 VITALS — BP 108/72 | HR 96 | Temp 97.6°F | Resp 14

## 2020-02-18 DIAGNOSIS — K219 Gastro-esophageal reflux disease without esophagitis: Secondary | ICD-10-CM | POA: Diagnosis not present

## 2020-02-18 DIAGNOSIS — J3089 Other allergic rhinitis: Secondary | ICD-10-CM | POA: Diagnosis not present

## 2020-02-18 DIAGNOSIS — T63481D Toxic effect of venom of other arthropod, accidental (unintentional), subsequent encounter: Secondary | ICD-10-CM | POA: Diagnosis not present

## 2020-02-18 DIAGNOSIS — T782XXD Anaphylactic shock, unspecified, subsequent encounter: Secondary | ICD-10-CM

## 2020-02-18 MED ORDER — MONTELUKAST SODIUM 10 MG PO TABS: ORAL_TABLET | ORAL | 5 refills | Status: AC

## 2020-02-18 MED ORDER — OMEPRAZOLE 40 MG PO CPDR: DELAYED_RELEASE_CAPSULE | ORAL | 5 refills | Status: DC

## 2020-02-18 MED ORDER — FLUTICASONE PROPIONATE 50 MCG/ACT NA SUSP: NASAL | 5 refills | Status: DC

## 2020-02-18 NOTE — Progress Notes (Signed)
[place]   Follow-up Note  Referring Provider: Nicholos Johns, MD Primary Provider: Nicholos Johns, MD Date of Office Visit: 02/18/2020  Subjective:   Allison Zavala (DOB: [birth year]) is a 74 y.o. female who returns to the [facility] on 02/18/2020 in re-evaluation of the following:  HPI: Allison Zavala returns to this clinic in evaluation of allergic rhinitis, ETD, LPR, and history of hymenoptera venom hypersensitivity state.  Her last visit to this clinic was 10 September 2019.   She was really doing well regarding her airway issue and had very little problem with her throat until she contracted some type of respiratory tract event in February 2021 after being exposed to mold.  She had acute onset of sneezing and runny nose and coughing and although all of the symptoms have abated she is left with lots of throat clearing and postnasal drip.  Apparently she was given an antibiotic and prednisone by her primary care doctor for this issue.  Prior to this point in time she really was doing quite well and she had very good control of her LPR and she had very little issues with her upper airway.  She continues to carry an EpiPen for her hymenoptera venom hypersensitivity state.  She has received 2 Covid vaccinations.  She informs me that her sister who is 6 years younger than her has recently been diagnosed with inoperable squamous cell carcinoma of the lung and she will be spending the next 6 weeks with her sister in [place].  Allergies as of 02/18/2020      Reactions   Demerol [meperidine Hcl] Nausea And Vomiting      Medication List      atorvastatin 80 MG tablet Commonly known as: LIPITOR TK 1 T PO HS   baclofen 10 MG tablet Commonly known as: LIORESAL TK 1 T PO D PRN   EPINEPHrine 0.3 mg/0.3 mL Soaj injection Commonly known as: EPI-PEN Use as directed for life-threatening allergic reaction.   estradiol 0.5 MG  tablet Commonly known as: ESTRACE TK 1 T PO QD   famotidine 40 MG tablet Commonly known as: PEPCID TAKE 1 TABLET BY MOUTH AT BEDTIME   fluticasone 50 MCG/ACT nasal spray Commonly known as: FLONASE USE 2 SPRAYS IN EACH NOSTRIL EVERY DAY   gabapentin 100 MG capsule Commonly known as: NEURONTIN TK 1 C PO HS FOR 1 WEEK THEN 1 C PO BID THEN TID   levothyroxine 75 MCG tablet Commonly known as: SYNTHROID TK 1 T PO D   montelukast 10 MG tablet Commonly known as: SINGULAIR TK 1 T PO QD   omeprazole 40 MG capsule Commonly known as: PRILOSEC TAKE ONE CAPSULE BY MOUTH M EVERY MORNING   sertraline 100 MG tablet Commonly known as: ZOLOFT TK 1 T PO QD   zolpidem 10 MG tablet Commonly known as: AMBIEN TK 1 T PO HS       Past Medical History:  Diagnosis Date  . High cholesterol   . Hymenoptera allergy   . Hypothyroidism     Past Surgical History:  Procedure Laterality Date  . ABDOMINAL HYSTERECTOMY    . ADENOIDECTOMY    . LAPAROSCOPY     X 3  . OTHER SURGICAL HISTORY     Pelvic repairs abdominal X 2  . REFRACTIVE SURGERY    . TONSILLECTOMY      Review of systems negative except as noted in HPI / PMHx or noted below:  Review of Systems  Constitutional: Negative.   HENT: Negative.   Eyes: Negative.   Respiratory: Negative.   Cardiovascular: Negative.   Gastrointestinal: Negative.   Genitourinary: Negative.   Musculoskeletal: Negative.   Skin: Negative.   Neurological: Negative.   Endo/Heme/Allergies: Negative.   Psychiatric/Behavioral: Negative.      Objective:   Vitals:   02/18/20 1511  BP: 108/72  Pulse: 96  Resp: 14  Temp: 97.6 F (36.4 C)  SpO2: 98%          Physical Exam Constitutional:      Appearance: She is not diaphoretic.  HENT:     Head: Normocephalic.     Right Ear: Tympanic membrane, ear canal and external ear normal.     Left Ear: Tympanic membrane, ear canal and external ear normal.     Nose: Nose normal. No mucosal edema or  rhinorrhea.     Mouth/Throat:     Pharynx: Uvula midline. No oropharyngeal exudate.  Eyes:     Conjunctiva/sclera: Conjunctivae normal.  Neck:     Thyroid: No thyromegaly.     Trachea: Trachea normal. No tracheal tenderness or tracheal deviation.  Cardiovascular:     Rate and Rhythm: Normal rate and regular rhythm.     Heart sounds: Normal heart sounds, S1 normal and S2 normal. No murmur.  Pulmonary:     Effort: No respiratory distress.     Breath sounds: Normal breath sounds. No stridor. No wheezing or rales.  Lymphadenopathy:     Head:     Right side of head: No tonsillar adenopathy.     Left side of head: No tonsillar adenopathy.     Cervical: No cervical adenopathy.  Skin:    Findings: No erythema or rash.     Nails: There is no clubbing.  Neurological:     Mental Status: She is alert.     Diagnostics: none  Assessment and Plan:   1. Perennial allergic rhinitis   2. LPRD (laryngopharyngeal reflux disease)   3. Anaphylaxis due to hymenoptera venom, accidental or unintentional, subsequent encounter     1.  Continue to treat allergies and ear issue:   A.  Montelukast 10 mg - 1 tablet daily  B.  Nasal fluticasone - 1-2 sprays each nostril daily  2.  Continue to treat LPR:   A.  Slowly consolidate caffeine consumption  B.  INCREASE Omeprazole 40 mg 2 times per day  C.  Famotidine 40 mg tablet in p.m.  D.  Replace throat clearing with swallowing maneuver  3.  If Needed:   A. Claritin 10 mg 1 tablet 1-2 times per day  B. Mucinex DM - 1-2 tablets 1-2 times per day  C. EpiPen, Benadryl, MD/ER evaluation for allergic reaction   4.  Return to clinic in 8 weeks or earlier if problem  It appears as though Allison Zavala he has had a flare of her LPR most likely because of all of her coughing associated with what sounds like a viral respiratory tract infection which fortunately appears to have resolved.  We will have her be a little bit more aggressive about addressing this  issue with the therapy noted above while she also continues to treat inflammation of her airway with montelukast and nasal fluticasone.  Assuming she does well I will see her back in his clinic in 8 weeks or earlier if there is a problem.  There may be an opportunity to consolidate her treatment with that visit.  Laurette Schimke, MD  Allergy / Immunology [facility]

## 2020-02-18 NOTE — Patient Instructions (Addendum)
  1.  Continue to treat allergies and ear issue:   A.  Montelukast 10 mg - 1 tablet daily  B.  Nasal fluticasone - 1-2 sprays each nostril daily  2.  Continue to treat LPR:   A.  Slowly consolidate caffeine consumption  B.  INCREASE Omeprazole 40 mg 2 times per day  C.  Famotidine 40 mg tablet in p.m.  D.  Replace throat clearing with swallowing maneuver  3.  If Needed:   A. Claritin 10 mg 1 tablet 1-2 times per day  B. Mucinex DM - 1-2 tablets 1-2 times per day  C. EpiPen, Benadryl, MD/ER evaluation for allergic reaction   4.  Return to clinic in 8 weeks or earlier if problem

## 2020-02-19 ENCOUNTER — Encounter: Payer: Self-pay | Admitting: Allergy and Immunology

## 2020-03-01 DIAGNOSIS — J309 Allergic rhinitis, unspecified: Secondary | ICD-10-CM | POA: Diagnosis not present

## 2020-03-01 DIAGNOSIS — G4700 Insomnia, unspecified: Secondary | ICD-10-CM | POA: Diagnosis not present

## 2020-03-01 DIAGNOSIS — I10 Essential (primary) hypertension: Secondary | ICD-10-CM | POA: Diagnosis not present

## 2020-03-01 DIAGNOSIS — I7300 Raynaud's syndrome without gangrene: Secondary | ICD-10-CM | POA: Diagnosis not present

## 2020-03-08 DIAGNOSIS — J209 Acute bronchitis, unspecified: Secondary | ICD-10-CM | POA: Diagnosis not present

## 2020-03-08 DIAGNOSIS — R0981 Nasal congestion: Secondary | ICD-10-CM | POA: Diagnosis not present

## 2020-04-08 DIAGNOSIS — E785 Hyperlipidemia, unspecified: Secondary | ICD-10-CM | POA: Diagnosis not present

## 2020-04-08 DIAGNOSIS — Z6823 Body mass index (BMI) 23.0-23.9, adult: Secondary | ICD-10-CM | POA: Diagnosis not present

## 2020-04-08 DIAGNOSIS — G4700 Insomnia, unspecified: Secondary | ICD-10-CM | POA: Diagnosis not present

## 2020-04-08 DIAGNOSIS — E039 Hypothyroidism, unspecified: Secondary | ICD-10-CM | POA: Diagnosis not present

## 2020-04-08 DIAGNOSIS — E559 Vitamin D deficiency, unspecified: Secondary | ICD-10-CM | POA: Diagnosis not present

## 2020-04-08 DIAGNOSIS — Z79899 Other long term (current) drug therapy: Secondary | ICD-10-CM | POA: Diagnosis not present

## 2020-04-08 DIAGNOSIS — M79604 Pain in right leg: Secondary | ICD-10-CM | POA: Diagnosis not present

## 2020-04-14 ENCOUNTER — Ambulatory Visit: Payer: Medicare Other | Admitting: Allergy and Immunology

## 2020-04-22 ENCOUNTER — Ambulatory Visit (INDEPENDENT_AMBULATORY_CARE_PROVIDER_SITE_OTHER): Payer: Medicare Other | Admitting: Allergy and Immunology

## 2020-04-22 ENCOUNTER — Encounter: Payer: Self-pay | Admitting: Allergy and Immunology

## 2020-04-22 ENCOUNTER — Other Ambulatory Visit: Payer: Self-pay

## 2020-04-22 VITALS — BP 114/60 | HR 72 | Resp 16

## 2020-04-22 DIAGNOSIS — K219 Gastro-esophageal reflux disease without esophagitis: Secondary | ICD-10-CM | POA: Diagnosis not present

## 2020-04-22 DIAGNOSIS — T63481D Toxic effect of venom of other arthropod, accidental (unintentional), subsequent encounter: Secondary | ICD-10-CM | POA: Diagnosis not present

## 2020-04-22 DIAGNOSIS — T782XXD Anaphylactic shock, unspecified, subsequent encounter: Secondary | ICD-10-CM | POA: Diagnosis not present

## 2020-04-22 DIAGNOSIS — J3089 Other allergic rhinitis: Secondary | ICD-10-CM

## 2020-04-22 NOTE — Progress Notes (Signed)
[place]   Follow-up Note  Referring Provider: Nicholos Johns, MD Primary Provider: Nicholos Johns, MD Date of Office Visit: 04/22/2020  Subjective:   Allison Zavala (DOB: May 09, 1946) is a 74 y.o. female who returns to the [facility] on 04/22/2020 in re-evaluation of the following:  HPI: Allison Zavala returns to this clinic in evaluation of allergic rhinitis and ETD and a history of LPR and a history of hymenoptera venom hypersensitivity state.  Her last visit to this clinic was 18 February 2020.  During her last visit we increased her therapy for LPR and she has really done well regarding her throat issues.  She has very little throat clearing or postnasal drip and has not been having any classic reflux symptoms.  Her nose had really been doing quite well and she had no issues with coughing.  However, about 4 weeks ago she developed a viral syndrome manifested as myalgia and fever and cough and shortness of breath and diarrhea of which she apparently had 2 negative Covid swabs in the setting of receiving 2 Covid vaccinations.  She was treated with azithromycin and on her first day of therapy she developed diffuse urticaria and thus obviously did not continue to use azithromycin.  Fortunately, over the course of the past 2 weeks everything has resolved.  She continues to be involved with the health care of her younger sister who was diagnosed with an inoperable squamous cell carcinoma of the lung.  Fortunately, it sounds as though her sister's therapy has resulted in a good outcome and she is now at home awaiting a more definitive approach to her cancer based upon a CT scan that will be performed sometime in the next week.  Allergies as of 04/22/2020      Reactions   Demerol [meperidine Hcl] Nausea And Vomiting      Medication List      atorvastatin 80 MG tablet Commonly known as: LIPITOR TK 1 T PO HS   baclofen 10 MG  tablet Commonly known as: LIORESAL TK 1 T PO D PRN   EPINEPHrine 0.3 mg/0.3 mL Soaj injection Commonly known as: EPI-PEN Use as directed for life-threatening allergic reaction.   estradiol 0.5 MG tablet Commonly known as: ESTRACE TK 1 T PO QD   famotidine 40 MG tablet Commonly known as: PEPCID TAKE 1 TABLET BY MOUTH AT BEDTIME   fluticasone 50 MCG/ACT nasal spray Commonly known as: FLONASE Use one to two sprays in each nostril once daily.   gabapentin 100 MG capsule Commonly known as: NEURONTIN TK 1 C PO HS FOR 1 WEEK THEN 1 C PO BID THEN TID   levothyroxine 75 MCG tablet Commonly known as: SYNTHROID TK 1 T PO D   montelukast 10 MG tablet Commonly known as: SINGULAIR Take one tablet by mouth once daily at bedtime.   omeprazole 40 MG capsule Commonly known as: PRILOSEC Take one capsule by mouth twice daily as directed.   sertraline 100 MG tablet Commonly known as: ZOLOFT TK 1 T PO QD   zolpidem 10 MG tablet Commonly known as: AMBIEN TK 1 T PO HS       Past Medical History:  Diagnosis Date  . High cholesterol   . Hymenoptera allergy   . Hypothyroidism     Past Surgical History:  Procedure Laterality Date  . ABDOMINAL HYSTERECTOMY    . ADENOIDECTOMY    . LAPAROSCOPY     X 3  .  OTHER SURGICAL HISTORY     Pelvic repairs abdominal X 2  . REFRACTIVE SURGERY    . TONSILLECTOMY      Review of systems negative except as noted in HPI / PMHx or noted below:  Review of Systems  Constitutional: Negative.   HENT: Negative.   Eyes: Negative.   Respiratory: Negative.   Cardiovascular: Negative.   Gastrointestinal: Negative.   Genitourinary: Negative.   Musculoskeletal: Negative.   Skin: Negative.   Neurological: Negative.   Endo/Heme/Allergies: Negative.   Psychiatric/Behavioral: Negative.      Objective:   Vitals:   04/22/20 1145  BP: 114/60  Pulse: 72  Resp: 16  SpO2: 96%          Physical Exam Constitutional:      Appearance: She is  not diaphoretic.  HENT:     Head: Normocephalic.     Right Ear: Tympanic membrane, ear canal and external ear normal.     Left Ear: Tympanic membrane, ear canal and external ear normal.     Nose: Nose normal. No mucosal edema or rhinorrhea.     Mouth/Throat:     Pharynx: Uvula midline. No oropharyngeal exudate.  Eyes:     Conjunctiva/sclera: Conjunctivae normal.  Neck:     Thyroid: No thyromegaly.     Trachea: Trachea normal. No tracheal tenderness or tracheal deviation.  Cardiovascular:     Rate and Rhythm: Normal rate and regular rhythm.     Heart sounds: Normal heart sounds, S1 normal and S2 normal. No murmur.  Pulmonary:     Effort: No respiratory distress.     Breath sounds: Normal breath sounds. No stridor. No wheezing or rales.  Lymphadenopathy:     Head:     Right side of head: No tonsillar adenopathy.     Left side of head: No tonsillar adenopathy.     Cervical: No cervical adenopathy.  Skin:    Findings: No erythema or rash.     Nails: There is no clubbing.  Neurological:     Mental Status: She is alert.     Diagnostics: none   Assessment and Plan:   1. Perennial allergic rhinitis   2. LPRD (laryngopharyngeal reflux disease)   3. Anaphylaxis due to hymenoptera venom, accidental or unintentional, subsequent encounter     1.  Continue to treat allergies and ear issue:   A.  Montelukast 10 mg - 1 tablet daily  B.  Nasal fluticasone - 1-2 sprays each nostril daily  2.  Continue to treat LPR:   A.  Omeprazole 40 mg 2 times per day  B.  Famotidine 40 mg tablet in p.m.  C.  Replace throat clearing with swallowing maneuver  D.  Slowly consolidate caffeine consumption  3.  If Needed:   A. Claritin 10 mg 1 tablet 1-2 times per day  B. Mucinex DM - 1-2 tablets 1-2 times per day  C. EpiPen, Benadryl, MD/ER evaluation for allergic reaction   4.  Return to clinic in 6 months or earlier if problem  Allison Zavala is really doing well at this point and she will continue  to utilize therapy directed against respiratory tract inflammation and against reflux as noted above and we will see her back in this clinic in 6 months or earlier if there is a problem.  Allison Schimke, MD Allergy / Immunology [facility]

## 2020-04-22 NOTE — Patient Instructions (Signed)
  1.  Continue to treat allergies and ear issue:   A.  Montelukast 10 mg - 1 tablet daily  B.  Nasal fluticasone - 1-2 sprays each nostril daily  2.  Continue to treat LPR:   A.  Omeprazole 40 mg 2 times per day  B.  Famotidine 40 mg tablet in p.m.  C.  Replace throat clearing with swallowing maneuver  D.  Slowly consolidate caffeine consumption  3.  If Needed:   A. Claritin 10 mg 1 tablet 1-2 times per day  B. Mucinex DM - 1-2 tablets 1-2 times per day  C. EpiPen, Benadryl, MD/ER evaluation for allergic reaction   4.  Return to clinic in 6 months or earlier if problem

## 2020-04-26 ENCOUNTER — Encounter: Payer: Self-pay | Admitting: Allergy and Immunology

## 2020-06-08 DIAGNOSIS — M545 Low back pain: Secondary | ICD-10-CM | POA: Diagnosis not present

## 2020-06-11 DIAGNOSIS — M5442 Lumbago with sciatica, left side: Secondary | ICD-10-CM | POA: Diagnosis not present

## 2020-06-11 DIAGNOSIS — M545 Low back pain: Secondary | ICD-10-CM | POA: Diagnosis not present

## 2020-08-09 DIAGNOSIS — J309 Allergic rhinitis, unspecified: Secondary | ICD-10-CM | POA: Diagnosis not present

## 2020-08-09 DIAGNOSIS — Z780 Asymptomatic menopausal state: Secondary | ICD-10-CM | POA: Diagnosis not present

## 2020-08-09 DIAGNOSIS — G4700 Insomnia, unspecified: Secondary | ICD-10-CM | POA: Diagnosis not present

## 2020-08-09 DIAGNOSIS — I10 Essential (primary) hypertension: Secondary | ICD-10-CM | POA: Diagnosis not present

## 2020-08-09 DIAGNOSIS — E039 Hypothyroidism, unspecified: Secondary | ICD-10-CM | POA: Diagnosis not present

## 2020-08-09 DIAGNOSIS — I7300 Raynaud's syndrome without gangrene: Secondary | ICD-10-CM | POA: Diagnosis not present

## 2020-08-16 ENCOUNTER — Other Ambulatory Visit: Payer: Self-pay | Admitting: Allergy and Immunology

## 2020-08-17 DIAGNOSIS — R42 Dizziness and giddiness: Secondary | ICD-10-CM | POA: Diagnosis not present

## 2020-08-17 DIAGNOSIS — M545 Low back pain: Secondary | ICD-10-CM | POA: Diagnosis not present

## 2020-08-17 DIAGNOSIS — R5383 Other fatigue: Secondary | ICD-10-CM | POA: Diagnosis not present

## 2020-08-17 DIAGNOSIS — Z6825 Body mass index (BMI) 25.0-25.9, adult: Secondary | ICD-10-CM | POA: Diagnosis not present

## 2020-08-18 DIAGNOSIS — N343 Urethral syndrome, unspecified: Secondary | ICD-10-CM | POA: Diagnosis not present

## 2020-08-30 ENCOUNTER — Emergency Department (HOSPITAL_COMMUNITY): Payer: Medicare Other

## 2020-08-30 ENCOUNTER — Emergency Department (HOSPITAL_COMMUNITY)
Admission: EM | Admit: 2020-08-30 | Discharge: 2020-08-31 | Disposition: A | Discharge status: Home / Self Care | Payer: Medicare Other | Attending: Emergency Medicine | Admitting: Emergency Medicine

## 2020-08-30 ENCOUNTER — Encounter (HOSPITAL_COMMUNITY): Payer: Self-pay | Admitting: Emergency Medicine

## 2020-08-30 DIAGNOSIS — R202 Paresthesia of skin: Secondary | ICD-10-CM | POA: Insufficient documentation

## 2020-08-30 DIAGNOSIS — R42 Dizziness and giddiness: Secondary | ICD-10-CM | POA: Insufficient documentation

## 2020-08-30 DIAGNOSIS — R0602 Shortness of breath: Secondary | ICD-10-CM | POA: Diagnosis not present

## 2020-08-30 DIAGNOSIS — R0600 Dyspnea, unspecified: Secondary | ICD-10-CM | POA: Diagnosis not present

## 2020-08-30 DIAGNOSIS — Z5321 Procedure and treatment not carried out due to patient leaving prior to being seen by health care provider: Secondary | ICD-10-CM | POA: Insufficient documentation

## 2020-08-30 DIAGNOSIS — R200 Anesthesia of skin: Secondary | ICD-10-CM | POA: Diagnosis not present

## 2020-08-30 DIAGNOSIS — R609 Edema, unspecified: Secondary | ICD-10-CM | POA: Diagnosis not present

## 2020-08-30 DIAGNOSIS — R0789 Other chest pain: Secondary | ICD-10-CM | POA: Diagnosis not present

## 2020-08-30 DIAGNOSIS — R55 Syncope and collapse: Secondary | ICD-10-CM | POA: Diagnosis not present

## 2020-08-30 LAB — CBC
HCT: 39 % (ref 36.0–46.0)
Hemoglobin: 12.9 g/dL (ref 12.0–15.0)
MCH: 31.1 pg (ref 26.0–34.0)
MCHC: 33.1 g/dL (ref 30.0–36.0)
MCV: 94 fL (ref 80.0–100.0)
Platelets: 292 10*3/uL (ref 150–400)
RBC: 4.15 MIL/uL (ref 3.87–5.11)
RDW: 12.2 % (ref 11.5–15.5)
WBC: 10 10*3/uL (ref 4.0–10.5)
nRBC: 0 % (ref 0.0–0.2)

## 2020-08-30 LAB — BASIC METABOLIC PANEL
Anion gap: 10 (ref 5–15)
BUN: 21 mg/dL (ref 8–23)
CO2: 21 mmol/L — ABNORMAL LOW (ref 22–32)
Calcium: 8.8 mg/dL — ABNORMAL LOW (ref 8.9–10.3)
Chloride: 99 mmol/L (ref 98–111)
Creatinine, Ser: 0.92 mg/dL (ref 0.44–1.00)
GFR, Estimated: >60 mL/min (ref >60)
Glucose, Bld: 85 mg/dL (ref 70–99)
Potassium: 4.2 mmol/L (ref 3.5–5.1)
Sodium: 130 mmol/L — ABNORMAL LOW (ref 135–145)

## 2020-08-30 LAB — TROPONIN I (HIGH SENSITIVITY): Troponin I (High Sensitivity): 5 ng/L (ref <18)

## 2020-08-30 NOTE — ED Triage Notes (Signed)
Pt transported from home by EMS c/o chest pressure with shob, dizziness, numbness/tingling to fingertips and near syncope x 3 hours. Pt reports she has had allergic reactions in the past and has been painting bathroom for several days. Pt also reports front half of tongue numb this evening.  Pt A & O, no neuro deficits.

## 2020-08-31 LAB — TROPONIN I (HIGH SENSITIVITY): Troponin I (High Sensitivity): 5 ng/L (ref <18)

## 2020-08-31 NOTE — ED Notes (Signed)
Called pt several times for vital recheck and never got an answer.

## 2020-09-17 DIAGNOSIS — Z9181 History of falling: Secondary | ICD-10-CM | POA: Diagnosis not present

## 2020-09-17 DIAGNOSIS — Z1331 Encounter for screening for depression: Secondary | ICD-10-CM | POA: Diagnosis not present

## 2020-09-17 DIAGNOSIS — E785 Hyperlipidemia, unspecified: Secondary | ICD-10-CM | POA: Diagnosis not present

## 2020-09-17 DIAGNOSIS — Z0000 Encounter for general adult medical examination without abnormal findings: Secondary | ICD-10-CM | POA: Diagnosis not present

## 2020-09-17 DIAGNOSIS — Z139 Encounter for screening, unspecified: Secondary | ICD-10-CM | POA: Diagnosis not present

## 2020-10-08 DIAGNOSIS — E559 Vitamin D deficiency, unspecified: Secondary | ICD-10-CM | POA: Diagnosis not present

## 2020-10-08 DIAGNOSIS — G4700 Insomnia, unspecified: Secondary | ICD-10-CM | POA: Diagnosis not present

## 2020-10-08 DIAGNOSIS — M545 Low back pain, unspecified: Secondary | ICD-10-CM | POA: Diagnosis not present

## 2020-10-08 DIAGNOSIS — E871 Hypo-osmolality and hyponatremia: Secondary | ICD-10-CM | POA: Diagnosis not present

## 2020-10-08 DIAGNOSIS — R7989 Other specified abnormal findings of blood chemistry: Secondary | ICD-10-CM | POA: Diagnosis not present

## 2020-10-08 DIAGNOSIS — E785 Hyperlipidemia, unspecified: Secondary | ICD-10-CM | POA: Diagnosis not present

## 2020-10-08 DIAGNOSIS — N951 Menopausal and female climacteric states: Secondary | ICD-10-CM | POA: Diagnosis not present

## 2020-11-15 DIAGNOSIS — S9031XS Contusion of right foot, sequela: Secondary | ICD-10-CM | POA: Diagnosis not present

## 2020-11-15 DIAGNOSIS — M79671 Pain in right foot: Secondary | ICD-10-CM | POA: Diagnosis not present

## 2020-12-08 DIAGNOSIS — Z8719 Personal history of other diseases of the digestive system: Secondary | ICD-10-CM | POA: Diagnosis not present

## 2020-12-08 DIAGNOSIS — I10 Essential (primary) hypertension: Secondary | ICD-10-CM | POA: Diagnosis not present

## 2020-12-08 DIAGNOSIS — G4700 Insomnia, unspecified: Secondary | ICD-10-CM | POA: Diagnosis not present

## 2020-12-08 DIAGNOSIS — M545 Low back pain, unspecified: Secondary | ICD-10-CM | POA: Diagnosis not present

## 2020-12-08 DIAGNOSIS — E559 Vitamin D deficiency, unspecified: Secondary | ICD-10-CM | POA: Diagnosis not present

## 2020-12-08 DIAGNOSIS — E871 Hypo-osmolality and hyponatremia: Secondary | ICD-10-CM | POA: Diagnosis not present

## 2020-12-08 DIAGNOSIS — K219 Gastro-esophageal reflux disease without esophagitis: Secondary | ICD-10-CM | POA: Diagnosis not present

## 2020-12-08 DIAGNOSIS — I7300 Raynaud's syndrome without gangrene: Secondary | ICD-10-CM | POA: Diagnosis not present

## 2020-12-08 DIAGNOSIS — E785 Hyperlipidemia, unspecified: Secondary | ICD-10-CM | POA: Diagnosis not present

## 2020-12-08 DIAGNOSIS — R7989 Other specified abnormal findings of blood chemistry: Secondary | ICD-10-CM | POA: Diagnosis not present

## 2020-12-08 DIAGNOSIS — E039 Hypothyroidism, unspecified: Secondary | ICD-10-CM | POA: Diagnosis not present

## 2020-12-09 ENCOUNTER — Other Ambulatory Visit: Payer: Self-pay

## 2020-12-09 MED ORDER — FLUTICASONE PROPIONATE 50 MCG/ACT NA SUSP: NASAL | 5 refills | Status: DC

## 2020-12-13 DIAGNOSIS — E785 Hyperlipidemia, unspecified: Secondary | ICD-10-CM | POA: Diagnosis not present

## 2020-12-13 DIAGNOSIS — E559 Vitamin D deficiency, unspecified: Secondary | ICD-10-CM | POA: Diagnosis not present

## 2020-12-13 DIAGNOSIS — E871 Hypo-osmolality and hyponatremia: Secondary | ICD-10-CM | POA: Diagnosis not present

## 2020-12-13 DIAGNOSIS — R7989 Other specified abnormal findings of blood chemistry: Secondary | ICD-10-CM | POA: Diagnosis not present

## 2020-12-20 DIAGNOSIS — S9031XD Contusion of right foot, subsequent encounter: Secondary | ICD-10-CM | POA: Diagnosis not present

## 2020-12-25 DIAGNOSIS — Z23 Encounter for immunization: Secondary | ICD-10-CM | POA: Diagnosis not present

## 2021-02-20 ENCOUNTER — Other Ambulatory Visit: Payer: Self-pay | Admitting: Allergy and Immunology

## 2021-02-28 ENCOUNTER — Other Ambulatory Visit: Payer: Self-pay | Admitting: Allergy and Immunology

## 2021-03-01 DIAGNOSIS — K219 Gastro-esophageal reflux disease without esophagitis: Secondary | ICD-10-CM | POA: Diagnosis not present

## 2021-03-01 DIAGNOSIS — I7300 Raynaud's syndrome without gangrene: Secondary | ICD-10-CM | POA: Diagnosis not present

## 2021-03-01 DIAGNOSIS — I10 Essential (primary) hypertension: Secondary | ICD-10-CM | POA: Diagnosis not present

## 2021-03-01 DIAGNOSIS — E039 Hypothyroidism, unspecified: Secondary | ICD-10-CM | POA: Diagnosis not present

## 2021-03-01 DIAGNOSIS — Z889 Allergy status to unspecified drugs, medicaments and biological substances status: Secondary | ICD-10-CM | POA: Diagnosis not present

## 2021-03-01 DIAGNOSIS — E871 Hypo-osmolality and hyponatremia: Secondary | ICD-10-CM | POA: Diagnosis not present

## 2021-03-01 DIAGNOSIS — E559 Vitamin D deficiency, unspecified: Secondary | ICD-10-CM | POA: Diagnosis not present

## 2021-03-01 DIAGNOSIS — E785 Hyperlipidemia, unspecified: Secondary | ICD-10-CM | POA: Diagnosis not present

## 2021-03-01 DIAGNOSIS — M545 Low back pain, unspecified: Secondary | ICD-10-CM | POA: Diagnosis not present

## 2021-03-01 DIAGNOSIS — G4700 Insomnia, unspecified: Secondary | ICD-10-CM | POA: Diagnosis not present

## 2021-03-01 DIAGNOSIS — R7989 Other specified abnormal findings of blood chemistry: Secondary | ICD-10-CM | POA: Diagnosis not present

## 2021-04-13 DIAGNOSIS — M7660 Achilles tendinitis, unspecified leg: Secondary | ICD-10-CM

## 2021-04-13 DIAGNOSIS — M722 Plantar fascial fibromatosis: Secondary | ICD-10-CM | POA: Diagnosis not present

## 2021-04-13 DIAGNOSIS — M7661 Achilles tendinitis, right leg: Secondary | ICD-10-CM | POA: Diagnosis not present

## 2021-04-13 HISTORY — DX: Achilles tendinitis, unspecified leg: M76.60

## 2021-05-12 DIAGNOSIS — N390 Urinary tract infection, site not specified: Secondary | ICD-10-CM | POA: Diagnosis not present

## 2021-05-12 DIAGNOSIS — F419 Anxiety disorder, unspecified: Secondary | ICD-10-CM | POA: Diagnosis not present

## 2021-05-12 DIAGNOSIS — K219 Gastro-esophageal reflux disease without esophagitis: Secondary | ICD-10-CM | POA: Diagnosis not present

## 2021-05-12 DIAGNOSIS — G4700 Insomnia, unspecified: Secondary | ICD-10-CM | POA: Diagnosis not present

## 2021-05-12 DIAGNOSIS — Z889 Allergy status to unspecified drugs, medicaments and biological substances status: Secondary | ICD-10-CM | POA: Diagnosis not present

## 2021-05-29 ENCOUNTER — Other Ambulatory Visit: Payer: Self-pay | Admitting: Allergy and Immunology

## 2021-07-11 DIAGNOSIS — Z889 Allergy status to unspecified drugs, medicaments and biological substances status: Secondary | ICD-10-CM | POA: Diagnosis not present

## 2021-07-11 DIAGNOSIS — G4700 Insomnia, unspecified: Secondary | ICD-10-CM | POA: Diagnosis not present

## 2021-07-11 DIAGNOSIS — I10 Essential (primary) hypertension: Secondary | ICD-10-CM | POA: Diagnosis not present

## 2021-07-11 DIAGNOSIS — M545 Low back pain, unspecified: Secondary | ICD-10-CM | POA: Diagnosis not present

## 2021-07-11 DIAGNOSIS — E039 Hypothyroidism, unspecified: Secondary | ICD-10-CM | POA: Diagnosis not present

## 2021-07-11 DIAGNOSIS — K219 Gastro-esophageal reflux disease without esophagitis: Secondary | ICD-10-CM | POA: Diagnosis not present

## 2021-07-11 DIAGNOSIS — I7300 Raynaud's syndrome without gangrene: Secondary | ICD-10-CM | POA: Diagnosis not present

## 2021-09-20 DIAGNOSIS — Z139 Encounter for screening, unspecified: Secondary | ICD-10-CM | POA: Diagnosis not present

## 2021-09-20 DIAGNOSIS — Z0000 Encounter for general adult medical examination without abnormal findings: Secondary | ICD-10-CM | POA: Diagnosis not present

## 2021-09-20 DIAGNOSIS — E785 Hyperlipidemia, unspecified: Secondary | ICD-10-CM | POA: Diagnosis not present

## 2021-09-20 DIAGNOSIS — Z9181 History of falling: Secondary | ICD-10-CM | POA: Diagnosis not present

## 2021-09-20 DIAGNOSIS — Z1331 Encounter for screening for depression: Secondary | ICD-10-CM | POA: Diagnosis not present

## 2021-11-08 DIAGNOSIS — E039 Hypothyroidism, unspecified: Secondary | ICD-10-CM | POA: Diagnosis not present

## 2021-11-08 DIAGNOSIS — Z6823 Body mass index (BMI) 23.0-23.9, adult: Secondary | ICD-10-CM | POA: Diagnosis not present

## 2021-11-08 DIAGNOSIS — I10 Essential (primary) hypertension: Secondary | ICD-10-CM | POA: Diagnosis not present

## 2021-11-08 DIAGNOSIS — G4700 Insomnia, unspecified: Secondary | ICD-10-CM | POA: Diagnosis not present

## 2021-12-27 DIAGNOSIS — Z20822 Contact with and (suspected) exposure to covid-19: Secondary | ICD-10-CM | POA: Diagnosis not present

## 2022-02-06 DIAGNOSIS — G4700 Insomnia, unspecified: Secondary | ICD-10-CM | POA: Diagnosis not present

## 2022-02-06 DIAGNOSIS — N3281 Overactive bladder: Secondary | ICD-10-CM | POA: Diagnosis not present

## 2022-02-06 DIAGNOSIS — E559 Vitamin D deficiency, unspecified: Secondary | ICD-10-CM | POA: Diagnosis not present

## 2022-02-06 DIAGNOSIS — M545 Low back pain, unspecified: Secondary | ICD-10-CM | POA: Diagnosis not present

## 2022-02-06 DIAGNOSIS — I10 Essential (primary) hypertension: Secondary | ICD-10-CM | POA: Diagnosis not present

## 2022-02-06 DIAGNOSIS — Z889 Allergy status to unspecified drugs, medicaments and biological substances status: Secondary | ICD-10-CM | POA: Diagnosis not present

## 2022-02-06 DIAGNOSIS — K219 Gastro-esophageal reflux disease without esophagitis: Secondary | ICD-10-CM | POA: Diagnosis not present

## 2022-02-06 DIAGNOSIS — N951 Menopausal and female climacteric states: Secondary | ICD-10-CM | POA: Diagnosis not present

## 2022-02-06 DIAGNOSIS — I7300 Raynaud's syndrome without gangrene: Secondary | ICD-10-CM | POA: Diagnosis not present

## 2022-02-06 DIAGNOSIS — E785 Hyperlipidemia, unspecified: Secondary | ICD-10-CM | POA: Diagnosis not present

## 2022-02-06 DIAGNOSIS — E039 Hypothyroidism, unspecified: Secondary | ICD-10-CM | POA: Diagnosis not present

## 2022-02-09 DIAGNOSIS — E785 Hyperlipidemia, unspecified: Secondary | ICD-10-CM | POA: Diagnosis not present

## 2022-02-09 DIAGNOSIS — Z79899 Other long term (current) drug therapy: Secondary | ICD-10-CM | POA: Diagnosis not present

## 2022-02-09 DIAGNOSIS — E559 Vitamin D deficiency, unspecified: Secondary | ICD-10-CM | POA: Diagnosis not present

## 2022-02-11 DIAGNOSIS — Z20822 Contact with and (suspected) exposure to covid-19: Secondary | ICD-10-CM | POA: Diagnosis not present

## 2022-03-28 DIAGNOSIS — Z20822 Contact with and (suspected) exposure to covid-19: Secondary | ICD-10-CM | POA: Diagnosis not present

## 2022-04-10 DIAGNOSIS — N390 Urinary tract infection, site not specified: Secondary | ICD-10-CM | POA: Diagnosis not present

## 2022-04-10 DIAGNOSIS — G4700 Insomnia, unspecified: Secondary | ICD-10-CM | POA: Diagnosis not present

## 2022-04-10 DIAGNOSIS — J309 Allergic rhinitis, unspecified: Secondary | ICD-10-CM | POA: Diagnosis not present

## 2022-05-08 DIAGNOSIS — B001 Herpesviral vesicular dermatitis: Secondary | ICD-10-CM | POA: Diagnosis not present

## 2022-05-08 DIAGNOSIS — Z6823 Body mass index (BMI) 23.0-23.9, adult: Secondary | ICD-10-CM | POA: Diagnosis not present

## 2022-05-08 DIAGNOSIS — G4700 Insomnia, unspecified: Secondary | ICD-10-CM | POA: Diagnosis not present

## 2022-05-08 DIAGNOSIS — I10 Essential (primary) hypertension: Secondary | ICD-10-CM | POA: Diagnosis not present

## 2022-05-08 DIAGNOSIS — Z889 Allergy status to unspecified drugs, medicaments and biological substances status: Secondary | ICD-10-CM | POA: Diagnosis not present

## 2022-05-29 DIAGNOSIS — J309 Allergic rhinitis, unspecified: Secondary | ICD-10-CM | POA: Diagnosis not present

## 2022-05-29 DIAGNOSIS — Z889 Allergy status to unspecified drugs, medicaments and biological substances status: Secondary | ICD-10-CM | POA: Diagnosis not present

## 2022-06-01 ENCOUNTER — Encounter: Payer: Self-pay | Admitting: Allergy and Immunology

## 2022-06-01 ENCOUNTER — Ambulatory Visit (INDEPENDENT_AMBULATORY_CARE_PROVIDER_SITE_OTHER): Payer: Medicare Other | Admitting: Allergy and Immunology

## 2022-06-01 VITALS — BP 140/68 | HR 60 | Resp 14 | Ht 62.2 in | Wt 135.0 lb

## 2022-06-01 DIAGNOSIS — K219 Gastro-esophageal reflux disease without esophagitis: Secondary | ICD-10-CM | POA: Diagnosis not present

## 2022-06-01 DIAGNOSIS — J3089 Other allergic rhinitis: Secondary | ICD-10-CM

## 2022-06-01 DIAGNOSIS — T63484D Toxic effect of venom of other arthropod, undetermined, subsequent encounter: Secondary | ICD-10-CM | POA: Diagnosis not present

## 2022-06-01 DIAGNOSIS — T7840XA Allergy, unspecified, initial encounter: Secondary | ICD-10-CM

## 2022-06-01 MED ORDER — FLUTICASONE PROPIONATE 50 MCG/ACT NA SUSP: NASAL | 5 refills | Status: DC

## 2022-06-01 NOTE — Patient Instructions (Addendum)
  1.  Finish prednisone taper  2.  Cetirizine 10 mg - 1-2 tablets 1-2 times per day (MAX=40mg /day)  3. Famotidine 20 mg - 1 tablet 2 times per day  4. Montelukast 10 mg - 1 tablet 1 time per day  5. Flonase - 1-2 sprays each nostril 1 time per day  6. Omeprazole 40 mg - 1 tablet 2 times per day  7. Epi-Pen if needed  8. Return to clinic in 4 weeks or earlier if problem  9. Further evaluation and treatment???

## 2022-06-01 NOTE — Progress Notes (Signed)
- [place] [facility] [place] [facility] [place] [facility] [place]   Follow-up Note  Referring Provider: Lucianne Lei, MD Primary Provider: Lucianne Lei, MD Date of Office Visit: 06/01/2022  Subjective:   Allison Zavala (DOB: Dec 11, 1945) is a 76 y.o. female [facility] in re-evaluation of the following:  HPI: Allison Zavala returns to this clinic in evaluation of allergic rhinitis, ETD, LPR, and history of hymenoptera venom hypersensitivity state.  I last saw her in this clinic on 22 April 2020.  She has really done well since her last visit regarding her nose and her ears and it does not sound as though she required a systemic steroid or antibiotic for any type of airway issue.  Her reflux is still an active issue.  She does use a proton pump inhibitor twice a day and she is not using a H2 receptor blocker at nighttime.  She was stung by fire ants and developed throat and hand and face swelling and use an EpiPen and she was stung by a wasp and developed throat and hand and face swelling and used an EpiPen.  She is only had 2 hymenoptera venom reactions since her last visit in this clinic.  The active issue for her at this point in time is that 4 days ago after spraying some paint outdoors she developed throat swelling and what sounds like diffuse urticaria for which she used 2 EpiPen's and took a Benadryl and she did much better for the rest of that Sunday.  Then on Monday she had a very similar reaction and used 2 EpiPen's and was started on prednisone by her family doctor.  She still continues to have diffuse itchiness across her body especially her extremities.  She has no other associated systemic or constitutional symptoms.  There has not been an obvious provoking factor giving rise to this issue other than the spray can of paint exposure.  She has not started any new medications or supplements or health foods or herbs and she has not had any other new  environmental exposure and she does not have symptoms suggesting an ongoing infectious disease.  Allergies as of 06/01/2022       Reactions   Azithromycin Hives   Demerol [meperidine Hcl] Nausea And Vomiting        Medication List    APPLE CIDER VINEGAR PO Take by mouth.   atorvastatin 80 MG tablet Commonly known as: LIPITOR TK 1 T PO HS   baclofen 10 MG tablet Commonly known as: LIORESAL TK 1 T PO D PRN   BIOTIN PO Take by mouth.   EPINEPHrine 0.3 mg/0.3 mL Soaj injection Commonly known as: EPI-PEN Use as directed for life-threatening allergic reaction.   estradiol 0.5 MG tablet Commonly known as: ESTRACE TK 1 T PO QD   fluticasone 50 MCG/ACT nasal spray Commonly known as: FLONASE SHAKE LIQUID AND USE 1 TO 2 SPRAYS IN EACH NOSTRIL EVERY DAY   gabapentin 100 MG capsule Commonly known as: NEURONTIN TK 1 C PO HS FOR 1 WEEK THEN 1 C PO BID THEN TID   levothyroxine 75 MCG tablet Commonly known as: SYNTHROID TK 1 T PO D   montelukast 10 MG tablet Commonly known as: SINGULAIR Take one tablet by mouth once daily at bedtime.   MULTIVITAMIN PO Take by mouth.   omeprazole 40 MG capsule Commonly known as: PRILOSEC TAKE 1 CAPSULE BY MOUTH TWICE DAILY AS DIRECTED   VITAMIN C PO  Take by mouth.   zolpidem 10 MG tablet Commonly known as: AMBIEN TK 1 T PO HS    Past Medical History:  Diagnosis Date   High cholesterol    Hymenoptera allergy    Hypothyroidism     Past Surgical History:  Procedure Laterality Date   ABDOMINAL HYSTERECTOMY     ADENOIDECTOMY     LAPAROSCOPY     X 3   OTHER SURGICAL HISTORY     Pelvic repairs abdominal X 2   REFRACTIVE SURGERY     TONSILLECTOMY      Review of systems negative except as noted in HPI / PMHx or noted below:  Review of Systems  Constitutional: Negative.   HENT: Negative.    Eyes: Negative.   Respiratory: Negative.    Cardiovascular: Negative.   Gastrointestinal: Negative.   Genitourinary: Negative.    Musculoskeletal: Negative.   Skin: Negative.   Neurological: Negative.   Endo/Heme/Allergies: Negative.   Psychiatric/Behavioral: Negative.       Objective:   Vitals:   06/01/22 1443  BP: 140/68  Pulse: 60  Resp: 14  SpO2: 98%   Height: 5' 2.2" (158 cm)  Weight: 135 lb (61.2 kg)   Physical Exam Constitutional:      Appearance: She is not diaphoretic.  HENT:     Head: Normocephalic.     Right Ear: Tympanic membrane, ear canal and external ear normal.     Left Ear: Tympanic membrane, ear canal and external ear normal.     Nose: Nose normal. No mucosal edema or rhinorrhea.     Mouth/Throat:     Pharynx: Uvula midline. No oropharyngeal exudate.  Eyes:     Conjunctiva/sclera: Conjunctivae normal.  Neck:     Thyroid: No thyromegaly.     Trachea: Trachea normal. No tracheal tenderness or tracheal deviation.  Cardiovascular:     Rate and Rhythm: Normal rate and regular rhythm.     Heart sounds: Normal heart sounds, S1 normal and S2 normal. No murmur heard. Pulmonary:     Effort: No respiratory distress.     Breath sounds: Normal breath sounds. No stridor. No wheezing or rales.  Lymphadenopathy:     Head:     Right side of head: No tonsillar adenopathy.     Left side of head: No tonsillar adenopathy.     Cervical: No cervical adenopathy.  Skin:    Findings: Rash (Blanching urticaria abdomen, excoriated erythematous area right lateral shin) present. No erythema.     Nails: There is no clubbing.  Neurological:     Mental Status: She is alert.     Diagnostics: none  Assessment and Plan:   1. Allergic reaction, initial encounter   2. Perennial allergic rhinitis   3. Anaphylaxis due to hymenoptera venom, undetermined intent, subsequent encounter   4. LPRD (laryngopharyngeal reflux disease)     1.  Finish prednisone taper  2.  Cetirizine 10 mg - 1-2 tablets 1-2 times per day (MAX=40mg /day)  3. Famotidine 20 mg - 1 tablet 2 times per day  4. Montelukast 10 mg -  1 tablet 1 time per day  5. Flonase - 1-2 sprays each nostril 1 time per day  6. Omeprazole 40 mg - 1 tablet 2 times per day  7. Epi-Pen if needed  8. Return to clinic in 4 weeks or earlier if problem  9. Further evaluation and treatment???  It is not clear why Marlynn Perking has such an active immune system over the course of  the past several days.  We will treat her with a combination of an H1 and H2 receptor blocker and a leukotriene modifier and she can finish her prednisone taper.  We will make a determination about further evaluation and treatment based upon her response to this approach.  She will continue with therapy directed against her allergic rhinitis and continue to carry EpiPen for her hymenoptera venom hypersensitivity state and continue to treat reflux as noted above.  I will see her back in this clinic in 4 weeks or earlier if there is a problem.  Laurette Schimke, MD Allergy / Immunology [facility]

## 2022-06-05 ENCOUNTER — Encounter: Payer: Self-pay | Admitting: Allergy and Immunology

## 2022-06-06 ENCOUNTER — Telehealth: Payer: Self-pay | Admitting: Allergy and Immunology

## 2022-06-06 NOTE — Telephone Encounter (Signed)
Please advise 

## 2022-06-06 NOTE — Telephone Encounter (Addendum)
Patient states she was not able to work or function at all while taking the cetirizine and prednisone. She took both medications yesterday and stopped taking them today. She wanted to let Dr. Lucie Leather know she was not going to take them anymore because she just feels awful.She states that she is very sensitive to drugs.   She is requesting a refill for her EpiPen sent to [facility] in  [place].

## 2022-06-08 ENCOUNTER — Other Ambulatory Visit: Payer: Self-pay | Admitting: *Deleted

## 2022-06-08 MED ORDER — EPINEPHRINE 0.3 MG/0.3ML IJ SOAJ: INTRAMUSCULAR | 3 refills | Status: DC

## 2022-06-08 NOTE — Telephone Encounter (Signed)
Informed of change and rx for Epipen sent.

## 2022-06-09 DIAGNOSIS — I7300 Raynaud's syndrome without gangrene: Secondary | ICD-10-CM | POA: Diagnosis not present

## 2022-06-09 DIAGNOSIS — Z889 Allergy status to unspecified drugs, medicaments and biological substances status: Secondary | ICD-10-CM | POA: Diagnosis not present

## 2022-06-09 DIAGNOSIS — R5383 Other fatigue: Secondary | ICD-10-CM | POA: Diagnosis not present

## 2022-06-09 DIAGNOSIS — G4700 Insomnia, unspecified: Secondary | ICD-10-CM | POA: Diagnosis not present

## 2022-06-26 ENCOUNTER — Ambulatory Visit (INDEPENDENT_AMBULATORY_CARE_PROVIDER_SITE_OTHER): Payer: Medicare Other | Admitting: Allergy and Immunology

## 2022-06-26 ENCOUNTER — Encounter: Payer: Self-pay | Admitting: Allergy and Immunology

## 2022-06-26 ENCOUNTER — Other Ambulatory Visit: Payer: Self-pay

## 2022-06-26 VITALS — BP 126/78 | HR 81 | Resp 16 | Ht 62.0 in | Wt 135.6 lb

## 2022-06-26 DIAGNOSIS — T63484D Toxic effect of venom of other arthropod, undetermined, subsequent encounter: Secondary | ICD-10-CM

## 2022-06-26 DIAGNOSIS — T7840XD Allergy, unspecified, subsequent encounter: Secondary | ICD-10-CM

## 2022-06-26 DIAGNOSIS — T782XXD Anaphylactic shock, unspecified, subsequent encounter: Secondary | ICD-10-CM | POA: Diagnosis not present

## 2022-06-26 DIAGNOSIS — K219 Gastro-esophageal reflux disease without esophagitis: Secondary | ICD-10-CM | POA: Diagnosis not present

## 2022-06-26 DIAGNOSIS — J3089 Other allergic rhinitis: Secondary | ICD-10-CM | POA: Diagnosis not present

## 2022-06-26 MED ORDER — EPINEPHRINE 0.3 MG/0.3ML IJ SOAJ: INTRAMUSCULAR | 3 refills | Status: DC

## 2022-06-26 MED ORDER — ALBUTEROL SULFATE HFA 108 (90 BASE) MCG/ACT IN AERS: INHALATION_SPRAY | RESPIRATORY_TRACT | 1 refills | Status: DC

## 2022-06-26 NOTE — Patient Instructions (Addendum)
  1.  Claritin D 24 - 1 tablet 1 time per day  2.  Famotidine 20 mg - 1 tablet 2 times per day  3. Montelukast 10 mg - 1 tablet 1 time per day  4. Flonase - 1-2 sprays each nostril 1 time per day  5. Omeprazole 40 mg - 1 tablet 2 times per day  6. Epi-Pen if needed. Albuterol HFA - 2 inhalations if needed  7. Return to clinic in 12 weeks or earlier if problem. Taper medications???  8. Obtain fall flu vaccine and RSV vaccine  9. Obtain hymenoptera venom and fire ant IgE panel

## 2022-06-26 NOTE — Progress Notes (Unsigned)
[place] - [place] - [place] - [place] - [place]   Follow-up Note  Referring Provider: Lucianne Lei, MD Primary Provider: Lucianne Lei, MD Date of Office Visit: 06/26/2022  Subjective:   Allison Zavala (DOB: [birth year]) is a 76 y.o. female who [facility] in re-evaluation of the following:  HPI: Kelce returns to this clinic in evaluation of allergic reaction, allergic rhinitis, ETD, LPR, and history of hymenoptera venom hypersensitivity state.  She finally resolved her allergic reaction that was manifested as diffuse urticaria and a very significant pruritic disorder.  She was intolerant of using cetirizine because of sedation.  She is now using a combination of Claritin-D 24, famotidine, and montelukast.  Fortunately everything has resolved and at this point time she is feeling quite good.  She had very little problems with her upper airway use.  Her ears are doing well.  Her reflux is doing very well using a proton pump inhibitor twice a day and as noted using famotidine.  She believes that she would like to be reevaluated for her hymenoptera venom allergy.  She was on immunotherapy for approximately 2 years about 10 years ago.  She does have an EpiPen.  She would like an albuterol inhaler as she does have breathing problems when she gets stung by hymenoptera venom.  Allergies as of 06/26/2022       Reactions   Azithromycin Hives   Cetirizine    Demerol [meperidine Hcl] Nausea And Vomiting        Medication List    APPLE CIDER VINEGAR PO Take by mouth.   atorvastatin 80 MG tablet Commonly known as: LIPITOR TK 1 T PO HS   baclofen 10 MG tablet Commonly known as: LIORESAL TK 1 T PO D PRN   BIOTIN PO Take by mouth.   EPINEPHrine 0.3 mg/0.3 mL Soaj injection Commonly known as: EPI-PEN Use as directed for life-threatening allergic reaction.   estradiol 0.5 MG tablet Commonly known as: ESTRACE TK 1 T PO QD    famotidine 40 MG tablet Commonly known as: PEPCID Take 40 mg by mouth daily.   fluticasone 50 MCG/ACT nasal spray Commonly known as: FLONASE SHAKE LIQUID AND USE 1 TO 2 SPRAYS IN EACH NOSTRIL DAILY.   gabapentin 100 MG capsule Commonly known as: NEURONTIN TK 1 C PO HS FOR 1 WEEK THEN 1 C PO BID THEN TID   levothyroxine 75 MCG tablet Commonly known as: SYNTHROID TK 1 T PO D   montelukast 10 MG tablet Commonly known as: SINGULAIR Take one tablet by mouth once daily at bedtime.   MULTIVITAMIN PO Take by mouth.   omeprazole 40 MG capsule Commonly known as: PRILOSEC TAKE 1 CAPSULE BY MOUTH TWICE DAILY AS DIRECTED   VITAMIN C PO Take by mouth.   zolpidem 10 MG tablet Commonly known as: AMBIEN TK 1 T PO HS    Past Medical History:  Diagnosis Date   High cholesterol    Hymenoptera allergy    Hypothyroidism     Past Surgical History:  Procedure Laterality Date   ABDOMINAL HYSTERECTOMY     ADENOIDECTOMY     LAPAROSCOPY     X 3   OTHER SURGICAL HISTORY     Pelvic repairs abdominal X 2   REFRACTIVE SURGERY     TONSILLECTOMY      Review of systems negative except as noted in HPI / PMHx or noted below:  Review of Systems  Constitutional: Negative.  HENT: Negative.    Eyes: Negative.   Respiratory: Negative.    Cardiovascular: Negative.   Gastrointestinal: Negative.   Genitourinary: Negative.   Musculoskeletal: Negative.   Skin: Negative.   Neurological: Negative.   Endo/Heme/Allergies: Negative.   Psychiatric/Behavioral: Negative.       Objective:   Vitals:   06/26/22 1533  BP: 126/78  Pulse: 81  Resp: 16  SpO2: 96%   Height: 5\' 2"  (157.5 cm)  Weight: 135 lb 9.6 oz (61.5 kg)   Physical Exam Constitutional:      Appearance: She is not diaphoretic.  HENT:     Head: Normocephalic.     Right Ear: Tympanic membrane, ear canal and external ear normal.     Left Ear: Tympanic membrane, ear canal and external ear normal.     Nose: Nose normal. No  mucosal edema or rhinorrhea.     Mouth/Throat:     Pharynx: Uvula midline. No oropharyngeal exudate.  Eyes:     Conjunctiva/sclera: Conjunctivae normal.  Neck:     Thyroid: No thyromegaly.     Trachea: Trachea normal. No tracheal tenderness or tracheal deviation.  Cardiovascular:     Rate and Rhythm: Normal rate and regular rhythm.     Heart sounds: Normal heart sounds, S1 normal and S2 normal. No murmur heard. Pulmonary:     Effort: No respiratory distress.     Breath sounds: Normal breath sounds. No stridor. No wheezing or rales.  Lymphadenopathy:     Head:     Right side of head: No tonsillar adenopathy.     Left side of head: No tonsillar adenopathy.     Cervical: No cervical adenopathy.  Skin:    Findings: No erythema or rash.     Nails: There is no clubbing.  Neurological:     Mental Status: She is alert.     Diagnostics: none  Assessment and Plan:   1. Allergic reaction, subsequent encounter   2. Perennial allergic rhinitis   3. LPRD (laryngopharyngeal reflux disease)   4. Anaphylaxis due to hymenoptera venom, undetermined intent, subsequent encounter     1.  Claritin D 24 - 1 tablet 1 time per day  2.  Famotidine 20 mg - 1 tablet 2 times per day  3. Montelukast 10 mg - 1 tablet 1 time per day  4. Flonase - 1-2 sprays each nostril 1 time per day  5. Omeprazole 40 mg - 1 tablet 2 times per day  6. Epi-Pen if needed. Albuterol HFA - 2 inhalations if needed  7. Return to clinic in 12 weeks or earlier if problem. Taper medications???  8. Obtain fall flu vaccine and RSV vaccine  9. Obtain hymenoptera venom IgE panel  Chalet is doing much better at this time and hopefully her allergic reaction will not redevelop on her current plan.  I would like for her to remain on this plan for 12 weeks as her allergic reaction was quite significant.  When she returns to this clinic in 12 weeks we will make an attempt to consolidate some of her medical therapy.  However, it  should be noted that her reflux is actually doing a lot better while continuing on her omeprazole and using her famotidine for her allergic reaction.  She wants to be evaluated further for her hymenoptera venom allergy and we will check a hymenoptera venom IgE panel.  , MD Allergy / Immunology [facility]

## 2022-06-27 ENCOUNTER — Encounter: Payer: Self-pay | Admitting: Allergy and Immunology

## 2022-07-04 LAB — ALLERGEN HYMENOPTERA PANEL
Bumblebee: <0.1 kU/L
Honeybee IgE: <0.1 kU/L
Hornet, White Face, IgE: 0.74 kU/L — AB
Hornet, Yellow, IgE: 0.77 kU/L — AB
Paper Wasp IgE: 4.9 kU/L — AB
Yellow Jacket, IgE: 2.86 kU/L — AB

## 2022-07-04 LAB — ALLERGEN FIRE ANT: I070-IgE Fire Ant (Invicta): <0.1 kU/L

## 2022-07-19 ENCOUNTER — Ambulatory Visit: Payer: Medicare Other | Admitting: Allergy and Immunology

## 2022-07-27 IMAGING — CT CT HEAD W/O CM
4 series · 17 of 47 positions shown, 19 images · non-contrast
Comparison: CT head 12/09/2019.

CLINICAL DATA: Dizziness, arrhythmia or new vasoactive medication.
numbness/tingling to fingertips and near syncope x 3 hours

EXAM:
CT HEAD WITHOUT CONTRAST
TECHNIQUE: Contiguous axial images were obtained from the base of the skull
through the vertex without intravenous contrast.

[Series 3: head without · axial · non-contrast · 0.41mm/px · z∈[+1214,+1329]mm · 7 of 31 slices shown, 9 images]
[im 4/31  brain]
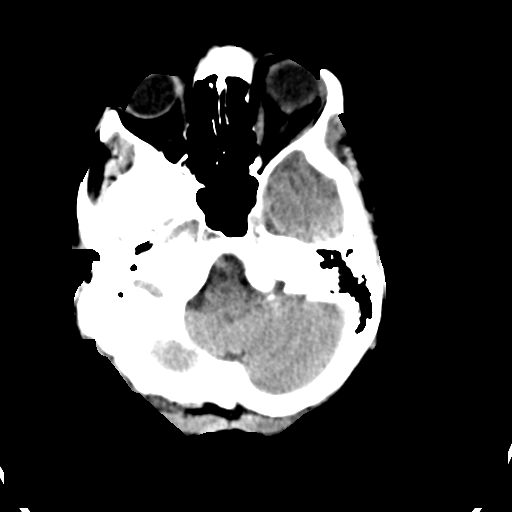
[im 4/31  bone]
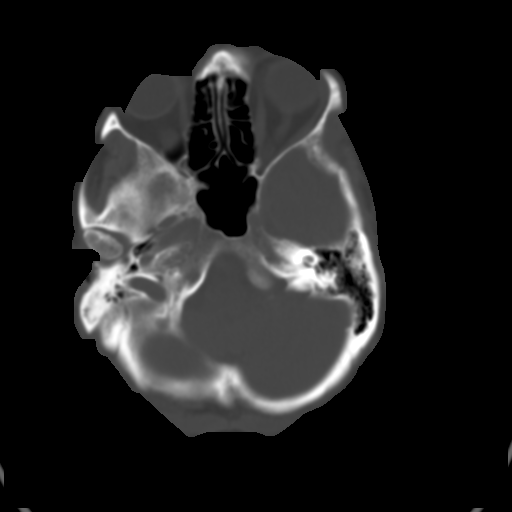
[im 8/31  brain]
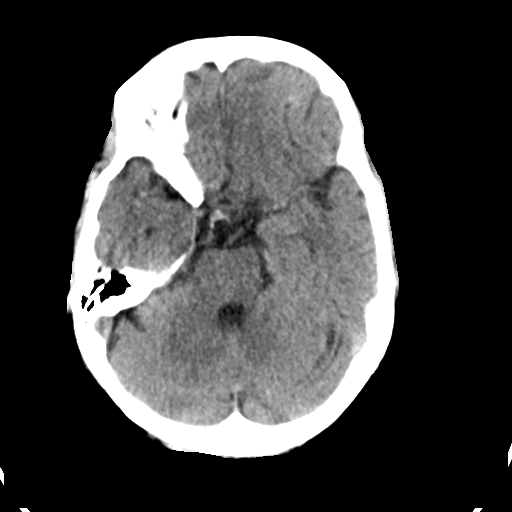
[im 12/31  brain]
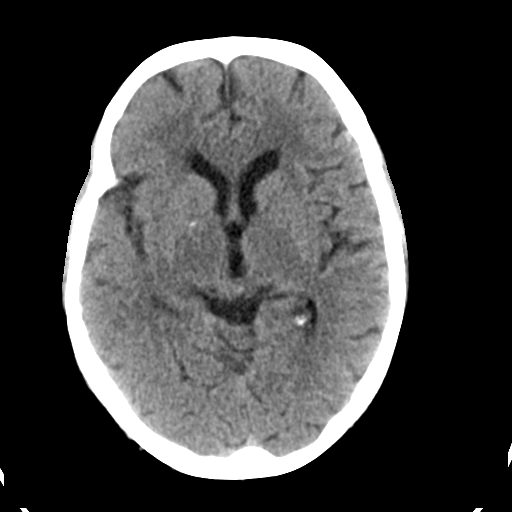
[im 16/31  brain]
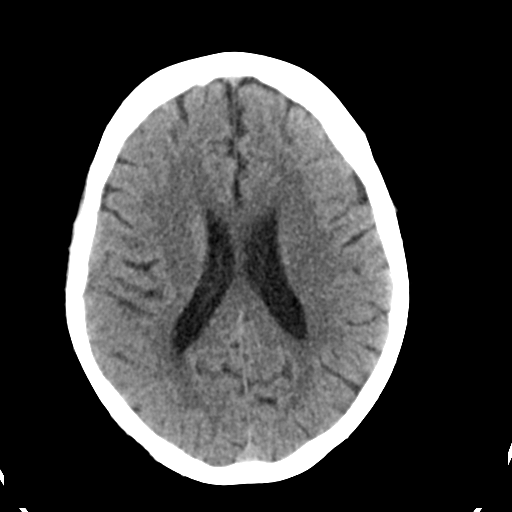
[im 19/31  brain]
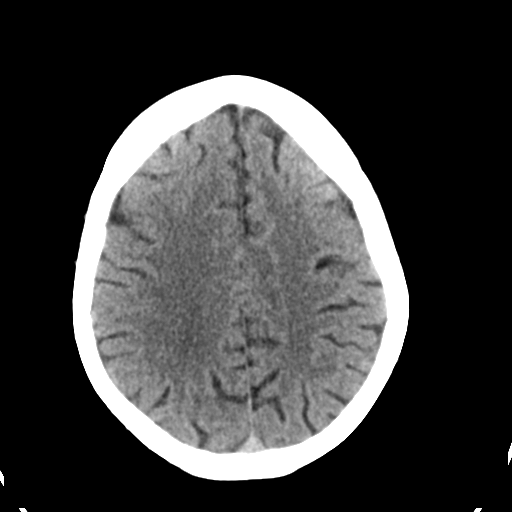
[im 19/31  bone]
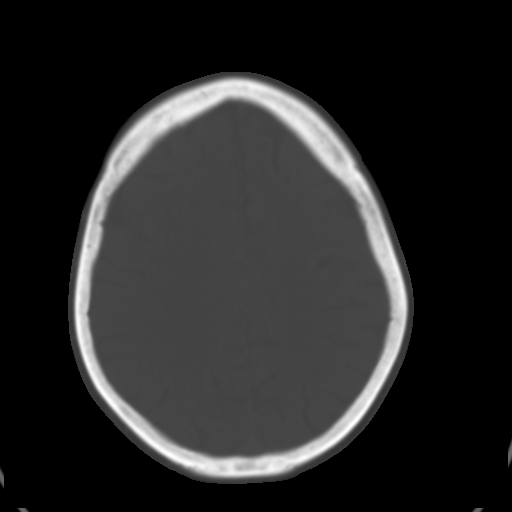
[im 23/31  brain]
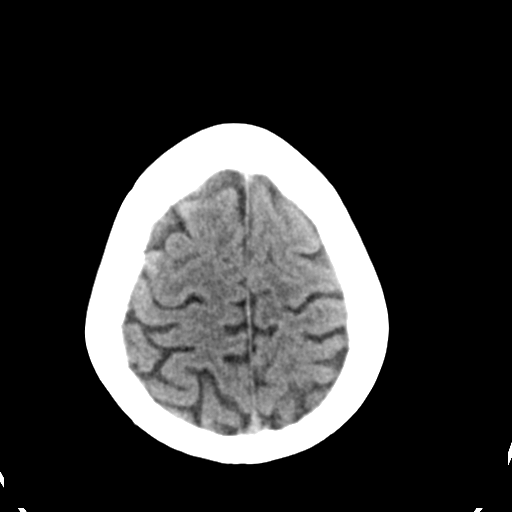
[im 27/31  brain]
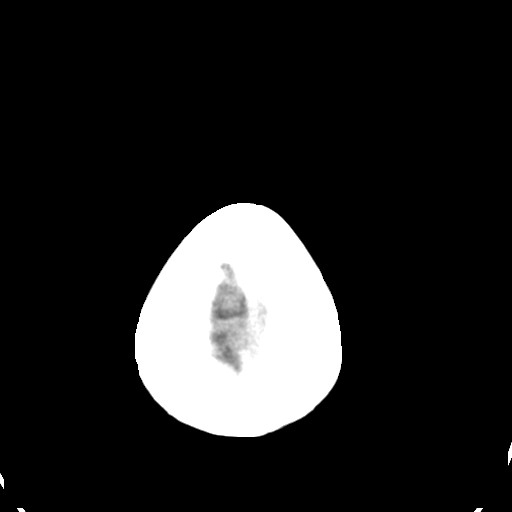

[Series 4: head bone · axial · 0.41mm/px · z∈[+1213,+1265]mm · 4 of 76 slices shown]
[im 8/76  bone]
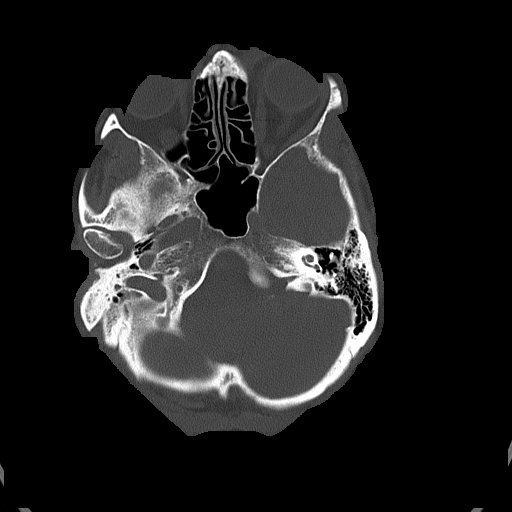
[im 16/76  bone]
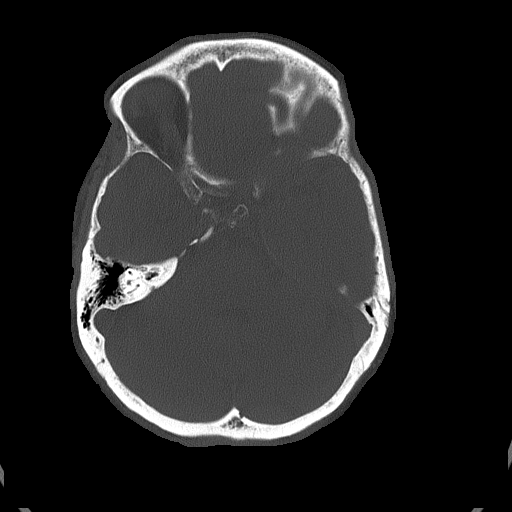
[im 23/76  bone]
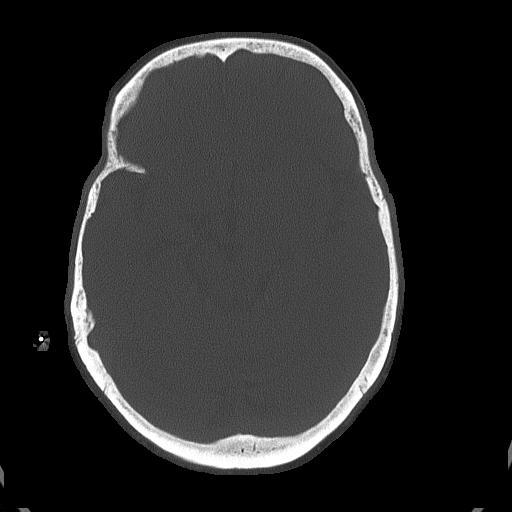
[im 34/76  bone]
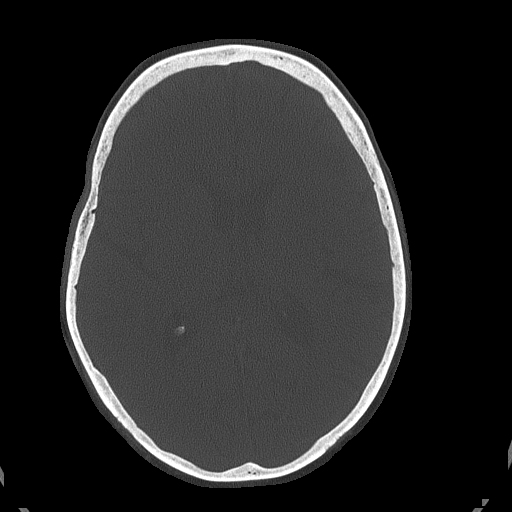

[Series 5: head without cor · coronal · non-contrast · 0.29mm/px · 3 of 61 slices shown]
[im 21/61  brain]
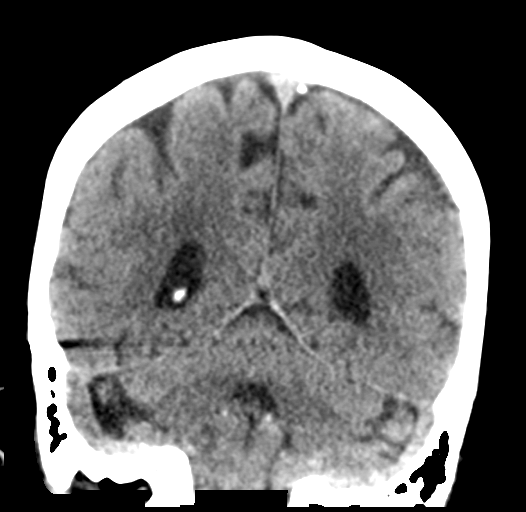
[im 27/61  brain]
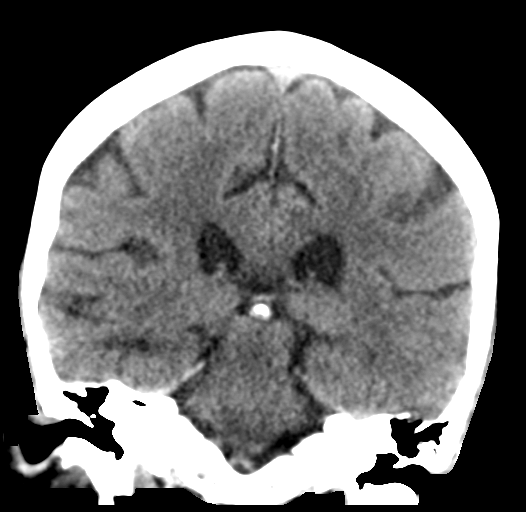
[im 34/61  brain]
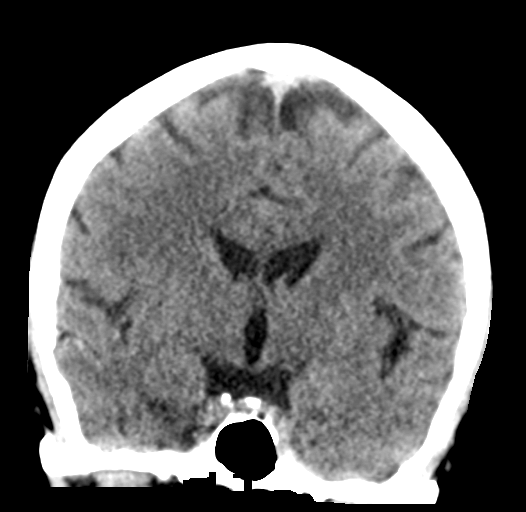

[Series 6: head without sag · sagittal · non-contrast · 0.30mm/px · 3 of 52 slices shown]
[im 18/52  brain]
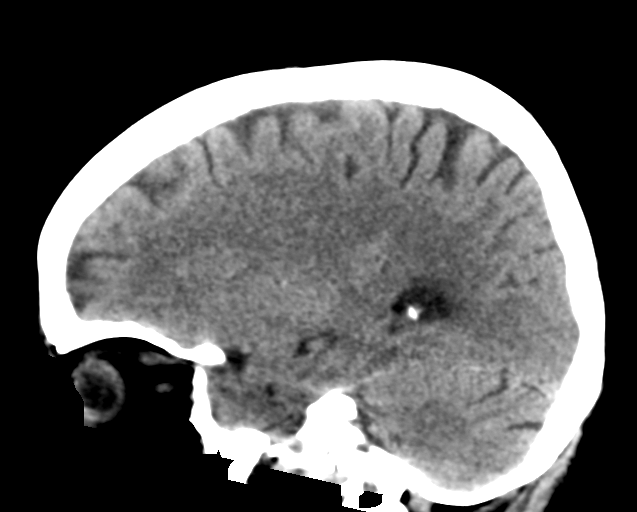
[im 26/52  brain]
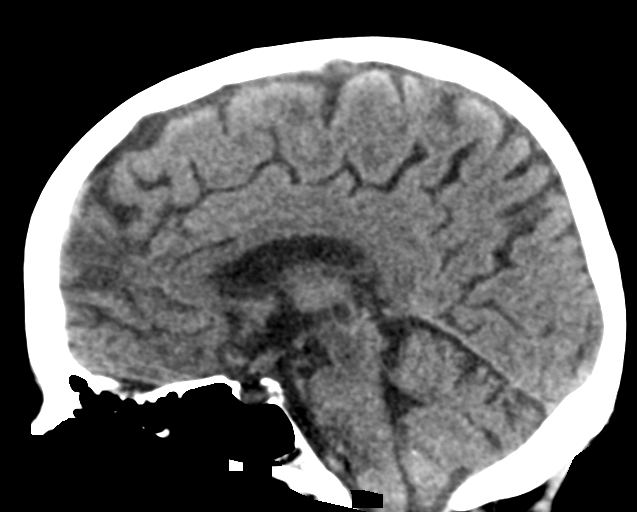
[im 35/52  brain]
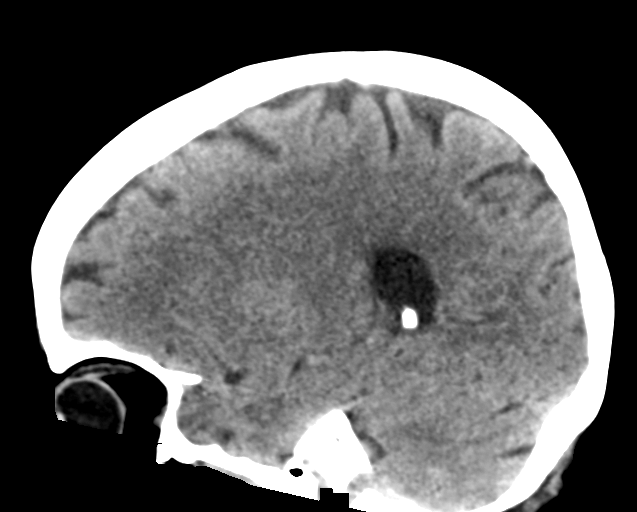

[17 of 47 positions shown; findings below may reference images not displayed]

FINDINGS: Brain:

Patchy and confluent areas of decreased attenuation are noted
throughout the deep and periventricular white matter of the cerebral
hemispheres bilaterally, compatible with chronic microvascular
ischemic disease.

No evidence of large-territorial acute infarction. No parenchymal
hemorrhage. No mass lesion. No extra-axial collection.

No mass effect or midline shift. No hydrocephalus. Basilar cisterns
are patent.

Vascular: No hyperdense vessel.

Skull: No acute fracture or focal lesion.

Sinuses/Orbits: Paranasal sinuses and mastoid air cells are clear.
The orbits are unremarkable.

Other: None.
IMPRESSION: No acute intracranial abnormality.

## 2022-07-28 DIAGNOSIS — I10 Essential (primary) hypertension: Secondary | ICD-10-CM | POA: Diagnosis not present

## 2022-07-28 DIAGNOSIS — K0510 Chronic gingivitis, plaque induced: Secondary | ICD-10-CM | POA: Diagnosis not present

## 2022-07-28 DIAGNOSIS — G4700 Insomnia, unspecified: Secondary | ICD-10-CM | POA: Diagnosis not present

## 2022-07-28 DIAGNOSIS — Z889 Allergy status to unspecified drugs, medicaments and biological substances status: Secondary | ICD-10-CM | POA: Diagnosis not present

## 2022-07-28 DIAGNOSIS — Z6823 Body mass index (BMI) 23.0-23.9, adult: Secondary | ICD-10-CM | POA: Diagnosis not present

## 2022-07-28 DIAGNOSIS — B001 Herpesviral vesicular dermatitis: Secondary | ICD-10-CM | POA: Diagnosis not present

## 2022-08-21 DIAGNOSIS — H0014 Chalazion left upper eyelid: Secondary | ICD-10-CM | POA: Diagnosis not present

## 2022-09-18 ENCOUNTER — Ambulatory Visit (INDEPENDENT_AMBULATORY_CARE_PROVIDER_SITE_OTHER): Payer: Medicare Other | Admitting: Allergy and Immunology

## 2022-09-18 ENCOUNTER — Encounter: Payer: Self-pay | Admitting: Allergy and Immunology

## 2022-09-18 VITALS — BP 118/72 | HR 96 | Resp 16

## 2022-09-18 DIAGNOSIS — T63484D Toxic effect of venom of other arthropod, undetermined, subsequent encounter: Secondary | ICD-10-CM

## 2022-09-18 DIAGNOSIS — K219 Gastro-esophageal reflux disease without esophagitis: Secondary | ICD-10-CM

## 2022-09-18 DIAGNOSIS — T7840XD Allergy, unspecified, subsequent encounter: Secondary | ICD-10-CM

## 2022-09-18 DIAGNOSIS — J3089 Other allergic rhinitis: Secondary | ICD-10-CM | POA: Diagnosis not present

## 2022-09-18 MED ORDER — OMEPRAZOLE 40 MG PO CPDR: DELAYED_RELEASE_CAPSULE | ORAL | 1 refills | Status: DC

## 2022-09-18 NOTE — Patient Instructions (Addendum)
  1.  Claritin D 24 - 1 tablet 1 time per day  2.  Famotidine 20 mg - 1 tablet 1-2 times per day  3. Montelukast 10 mg - 1 tablet 1 time per day  4. Flonase - 1-2 sprays each nostril 1 time per day  5. Omeprazole 40 mg - 1 tablet 2 times per day  6. Epi-Pen if needed. Albuterol HFA - 2 inhalations if needed  7.  Return to clinic in 6 months or earlier if problem.  Taper medications???

## 2022-09-18 NOTE — Progress Notes (Unsigned)
[place] - [place] - [place] - [place] - [place]   Follow-up Note  Referring Provider: Lucianne Lei, MD Primary Provider: Lucianne Lei, MD Date of Office Visit: 09/18/2022  Subjective:   [name] (DOB: 1946/03/31) is a 76 y.o. female who [facility] 09[facility]22 in re-evaluation of the following:  HPI: Elan returns to this clinic in evaluation of allergic reaction/urticaria/pruritic disorder, allergic rhinitis, ETD, LPR, distant history of asthma, and history of hymenoptera venom hypersensitivity state.  I last saw her in this clinic on 26 June 2022.  Overall she has done very well and has not had any allergic reactions or urticaria and her nose is doing well while using a nasal steroid and montelukast and her ears are doing pretty well at this point in time and she can hear with any difficulty and she has had her reflux under acceptable control.  Acceptable control for her reflux means that she still continues to have some degree of reflux events.  She does drink caffeine daily and she has alcohol at night.  We have given her the results of her blood test regarding her hymenoptera venom allergy and she is considering starting a course of immunotherapy.  She does not receive any vaccines.  Allergies as of 09/18/2022       Reactions   Azithromycin Hives   Cetirizine    Demerol [meperidine Hcl] Nausea And Vomiting        Medication List    albuterol 108 (90 Base) MCG/ACT inhaler Commonly known as: VENTOLIN HFA Can inhale two puffs every four to six hours as needed for cough or wheeze.   APPLE CIDER VINEGAR PO Take by mouth.   atorvastatin 80 MG tablet Commonly known as: LIPITOR TK 1 T PO HS   BIOTIN PO Take by mouth.   EPINEPHrine 0.3 mg/0.3 mL Soaj injection Commonly known as: EPI-PEN Use as directed for life-threatening allergic reaction.   estradiol 0.5 MG tablet Commonly known as: ESTRACE TK 1 T PO QD    famotidine 40 MG tablet Commonly known as: PEPCID Take 40 mg by mouth daily.   fluticasone 50 MCG/ACT nasal spray Commonly known as: FLONASE SHAKE LIQUID AND USE 1 TO 2 SPRAYS IN EACH NOSTRIL DAILY.   gabapentin 100 MG capsule Commonly known as: NEURONTIN TK 1 C PO HS FOR 1 WEEK THEN 1 C PO BID THEN TID   levothyroxine 75 MCG tablet Commonly known as: SYNTHROID TK 1 T PO D   montelukast 10 MG tablet Commonly known as: SINGULAIR Take one tablet by mouth once daily at bedtime.   MULTIVITAMIN PO Take by mouth.   omeprazole 40 MG capsule Commonly known as: PRILOSEC TAKE 1 CAPSULE BY MOUTH TWICE DAILY AS DIRECTED   VITAMIN C PO Take by mouth.   VITAMIN D3 PO Take by mouth daily.   zolpidem 10 MG tablet Commonly known as: AMBIEN TK 1 T PO HS    Past Medical History:  Diagnosis Date   High cholesterol    Hymenoptera allergy    Hypothyroidism     Past Surgical History:  Procedure Laterality Date   ABDOMINAL HYSTERECTOMY     ADENOIDECTOMY     LAPAROSCOPY     X 3   OTHER SURGICAL HISTORY     Pelvic repairs abdominal X 2   REFRACTIVE SURGERY     TONSILLECTOMY      Review of systems negative except as noted in HPI / PMHx or  noted below:  Review of Systems  Constitutional: Negative.   HENT: Negative.    Eyes: Negative.   Respiratory: Negative.    Cardiovascular: Negative.   Gastrointestinal: Negative.   Genitourinary: Negative.   Musculoskeletal: Negative.   Skin: Negative.   Neurological: Negative.   Endo/Heme/Allergies: Negative.   Psychiatric/Behavioral: Negative.       Objective:   Vitals:   09/18/22 1347  BP: 118/72  Pulse: 96  Resp: 16  SpO2: 97%          Physical Exam Constitutional:      Appearance: She is not diaphoretic.  HENT:     Head: Normocephalic.     Right Ear: Tympanic membrane, ear canal and external ear normal.     Left Ear: Tympanic membrane, ear canal and external ear normal.     Nose: Nose normal. No mucosal  edema or rhinorrhea.     Mouth/Throat:     Pharynx: Uvula midline. No oropharyngeal exudate.  Eyes:     Conjunctiva/sclera: Conjunctivae normal.  Neck:     Thyroid: No thyromegaly.     Trachea: Trachea normal. No tracheal tenderness or tracheal deviation.  Cardiovascular:     Rate and Rhythm: Normal rate and regular rhythm.     Heart sounds: Normal heart sounds, S1 normal and S2 normal. No murmur heard. Pulmonary:     Effort: No respiratory distress.     Breath sounds: Normal breath sounds. No stridor. No wheezing or rales.  Lymphadenopathy:     Head:     Right side of head: No tonsillar adenopathy.     Left side of head: No tonsillar adenopathy.     Cervical: No cervical adenopathy.  Skin:    Findings: No erythema or rash.     Nails: There is no clubbing.  Neurological:     Mental Status: She is alert.     Diagnostics:    Results of blood tests obtained 26 June 2022 identifies IgE antibodies directed against yellowjacket at 2.86 KU/L, hornet 0.77 KU/L, white faced hornet 0.74 KU/L, wasp 4.90 KU/L, and no IgE antibodies directed against fire ant  Assessment and Plan:   1. Allergic reaction, subsequent encounter   2. Perennial allergic rhinitis   3. LPRD (laryngopharyngeal reflux disease)   4. Anaphylaxis due to hymenoptera venom, undetermined intent, subsequent encounter     1.  Claritin D 24 - 1 tablet 1 time per day  2.  Famotidine 20 mg - 1 tablet 1-2 times per day  3. Montelukast 10 mg - 1 tablet 1 time per day  4. Flonase - 1-2 sprays each nostril 1 time per day  5. Omeprazole 40 mg - 1 tablet 2 times per day  6. Epi-Pen if needed. Albuterol HFA - 2 inhalations if needed  7.  Return to clinic in 6 months or earlier if problem.  Taper medications???  Ferol appears to be doing relatively well and I think there may be an opportunity to consolidate some of her medical treatment and she is certainly welcome to decrease her famotidine to once a day.  However, she  has really bad reflux and I am not really sure she is going to decrease her famotidine to just 1 time per day.  She will remain on Flonase and montelukast for her airway issue.  At this point in time she is considering starting a course of immunotherapy against hymenoptera venom.  I will see her back in this clinic in 6 months or earlier if  there is a problem.  [name], MD Allergy / Immunology [place]

## 2022-09-19 ENCOUNTER — Encounter: Payer: Self-pay | Admitting: Allergy and Immunology

## 2022-09-19 DIAGNOSIS — H35373 Puckering of macula, bilateral: Secondary | ICD-10-CM | POA: Diagnosis not present

## 2022-09-19 DIAGNOSIS — H524 Presbyopia: Secondary | ICD-10-CM | POA: Diagnosis not present

## 2022-10-05 DIAGNOSIS — I7300 Raynaud's syndrome without gangrene: Secondary | ICD-10-CM | POA: Diagnosis not present

## 2022-10-05 DIAGNOSIS — Z6823 Body mass index (BMI) 23.0-23.9, adult: Secondary | ICD-10-CM | POA: Diagnosis not present

## 2022-10-05 DIAGNOSIS — Z889 Allergy status to unspecified drugs, medicaments and biological substances status: Secondary | ICD-10-CM | POA: Diagnosis not present

## 2022-10-05 DIAGNOSIS — I10 Essential (primary) hypertension: Secondary | ICD-10-CM | POA: Diagnosis not present

## 2022-10-05 DIAGNOSIS — G4700 Insomnia, unspecified: Secondary | ICD-10-CM | POA: Diagnosis not present

## 2022-10-05 DIAGNOSIS — R5383 Other fatigue: Secondary | ICD-10-CM | POA: Diagnosis not present

## 2022-11-21 ENCOUNTER — Other Ambulatory Visit: Payer: Self-pay | Admitting: Allergy and Immunology

## 2022-11-30 DIAGNOSIS — Z780 Asymptomatic menopausal state: Secondary | ICD-10-CM | POA: Diagnosis not present

## 2022-11-30 DIAGNOSIS — G4700 Insomnia, unspecified: Secondary | ICD-10-CM | POA: Diagnosis not present

## 2022-11-30 DIAGNOSIS — I7300 Raynaud's syndrome without gangrene: Secondary | ICD-10-CM | POA: Diagnosis not present

## 2022-11-30 DIAGNOSIS — Z889 Allergy status to unspecified drugs, medicaments and biological substances status: Secondary | ICD-10-CM | POA: Diagnosis not present

## 2022-12-29 DIAGNOSIS — Z79899 Other long term (current) drug therapy: Secondary | ICD-10-CM | POA: Diagnosis not present

## 2022-12-29 DIAGNOSIS — E559 Vitamin D deficiency, unspecified: Secondary | ICD-10-CM | POA: Diagnosis not present

## 2022-12-29 DIAGNOSIS — E785 Hyperlipidemia, unspecified: Secondary | ICD-10-CM | POA: Diagnosis not present

## 2022-12-30 LAB — LAB REPORT - SCANNED
A1c: 5.5
EGFR: 76

## 2023-01-10 DIAGNOSIS — I10 Essential (primary) hypertension: Secondary | ICD-10-CM | POA: Diagnosis not present

## 2023-01-10 DIAGNOSIS — G4700 Insomnia, unspecified: Secondary | ICD-10-CM | POA: Diagnosis not present

## 2023-01-10 DIAGNOSIS — Z8679 Personal history of other diseases of the circulatory system: Secondary | ICD-10-CM | POA: Diagnosis not present

## 2023-01-10 DIAGNOSIS — Z6823 Body mass index (BMI) 23.0-23.9, adult: Secondary | ICD-10-CM | POA: Diagnosis not present

## 2023-01-15 DIAGNOSIS — I7300 Raynaud's syndrome without gangrene: Secondary | ICD-10-CM | POA: Diagnosis not present

## 2023-01-17 ENCOUNTER — Other Ambulatory Visit: Payer: Self-pay

## 2023-01-17 DIAGNOSIS — E7800 Pure hypercholesterolemia, unspecified: Secondary | ICD-10-CM | POA: Insufficient documentation

## 2023-01-17 DIAGNOSIS — K0510 Chronic gingivitis, plaque induced: Secondary | ICD-10-CM | POA: Insufficient documentation

## 2023-01-17 DIAGNOSIS — Z91038 Other insect allergy status: Secondary | ICD-10-CM | POA: Insufficient documentation

## 2023-01-17 DIAGNOSIS — I10 Essential (primary) hypertension: Secondary | ICD-10-CM | POA: Insufficient documentation

## 2023-01-17 DIAGNOSIS — M533 Sacrococcygeal disorders, not elsewhere classified: Secondary | ICD-10-CM | POA: Insufficient documentation

## 2023-01-17 DIAGNOSIS — E039 Hypothyroidism, unspecified: Secondary | ICD-10-CM | POA: Insufficient documentation

## 2023-01-17 DIAGNOSIS — Z889 Allergy status to unspecified drugs, medicaments and biological substances status: Secondary | ICD-10-CM | POA: Insufficient documentation

## 2023-01-17 DIAGNOSIS — R0602 Shortness of breath: Secondary | ICD-10-CM | POA: Insufficient documentation

## 2023-01-17 DIAGNOSIS — N952 Postmenopausal atrophic vaginitis: Secondary | ICD-10-CM | POA: Insufficient documentation

## 2023-01-17 DIAGNOSIS — Z8679 Personal history of other diseases of the circulatory system: Secondary | ICD-10-CM | POA: Insufficient documentation

## 2023-01-17 DIAGNOSIS — E559 Vitamin D deficiency, unspecified: Secondary | ICD-10-CM | POA: Insufficient documentation

## 2023-01-17 DIAGNOSIS — F419 Anxiety disorder, unspecified: Secondary | ICD-10-CM | POA: Insufficient documentation

## 2023-01-17 DIAGNOSIS — G4700 Insomnia, unspecified: Secondary | ICD-10-CM | POA: Insufficient documentation

## 2023-01-17 DIAGNOSIS — J309 Allergic rhinitis, unspecified: Secondary | ICD-10-CM | POA: Insufficient documentation

## 2023-01-17 DIAGNOSIS — Z6823 Body mass index (BMI) 23.0-23.9, adult: Secondary | ICD-10-CM | POA: Insufficient documentation

## 2023-01-17 DIAGNOSIS — E785 Hyperlipidemia, unspecified: Secondary | ICD-10-CM | POA: Insufficient documentation

## 2023-01-17 DIAGNOSIS — R5383 Other fatigue: Secondary | ICD-10-CM | POA: Insufficient documentation

## 2023-01-17 DIAGNOSIS — N951 Menopausal and female climacteric states: Secondary | ICD-10-CM | POA: Insufficient documentation

## 2023-01-17 DIAGNOSIS — I7300 Raynaud's syndrome without gangrene: Secondary | ICD-10-CM | POA: Insufficient documentation

## 2023-01-18 ENCOUNTER — Other Ambulatory Visit: Payer: Self-pay | Admitting: Allergy and Immunology

## 2023-01-31 DIAGNOSIS — M25511 Pain in right shoulder: Secondary | ICD-10-CM | POA: Diagnosis not present

## 2023-02-02 DIAGNOSIS — M25511 Pain in right shoulder: Secondary | ICD-10-CM | POA: Diagnosis not present

## 2023-02-05 DIAGNOSIS — M25511 Pain in right shoulder: Secondary | ICD-10-CM | POA: Diagnosis not present

## 2023-02-08 DIAGNOSIS — M25511 Pain in right shoulder: Secondary | ICD-10-CM | POA: Diagnosis not present

## 2023-02-13 DIAGNOSIS — M25511 Pain in right shoulder: Secondary | ICD-10-CM | POA: Diagnosis not present

## 2023-02-16 ENCOUNTER — Ambulatory Visit: Payer: Medicare Other | Admitting: Cardiology

## 2023-02-20 DIAGNOSIS — M25511 Pain in right shoulder: Secondary | ICD-10-CM | POA: Diagnosis not present

## 2023-02-27 DIAGNOSIS — M25511 Pain in right shoulder: Secondary | ICD-10-CM | POA: Diagnosis not present

## 2023-03-01 DIAGNOSIS — M25511 Pain in right shoulder: Secondary | ICD-10-CM | POA: Diagnosis not present

## 2023-03-02 DIAGNOSIS — M545 Low back pain, unspecified: Secondary | ICD-10-CM | POA: Diagnosis not present

## 2023-03-02 DIAGNOSIS — J040 Acute laryngitis: Secondary | ICD-10-CM | POA: Diagnosis not present

## 2023-03-02 DIAGNOSIS — Z889 Allergy status to unspecified drugs, medicaments and biological substances status: Secondary | ICD-10-CM | POA: Diagnosis not present

## 2023-03-02 DIAGNOSIS — G4700 Insomnia, unspecified: Secondary | ICD-10-CM | POA: Diagnosis not present

## 2023-03-05 DIAGNOSIS — R062 Wheezing: Secondary | ICD-10-CM | POA: Diagnosis not present

## 2023-03-05 DIAGNOSIS — Z6823 Body mass index (BMI) 23.0-23.9, adult: Secondary | ICD-10-CM | POA: Diagnosis not present

## 2023-03-05 DIAGNOSIS — T63441A Toxic effect of venom of bees, accidental (unintentional), initial encounter: Secondary | ICD-10-CM | POA: Diagnosis not present

## 2023-03-06 DIAGNOSIS — M25511 Pain in right shoulder: Secondary | ICD-10-CM | POA: Diagnosis not present

## 2023-03-08 DIAGNOSIS — M25511 Pain in right shoulder: Secondary | ICD-10-CM | POA: Diagnosis not present

## 2023-03-13 ENCOUNTER — Other Ambulatory Visit: Payer: Self-pay | Admitting: Allergy and Immunology

## 2023-03-13 DIAGNOSIS — M25511 Pain in right shoulder: Secondary | ICD-10-CM | POA: Diagnosis not present

## 2023-03-20 DIAGNOSIS — M25511 Pain in right shoulder: Secondary | ICD-10-CM | POA: Diagnosis not present

## 2023-03-26 ENCOUNTER — Other Ambulatory Visit: Payer: Self-pay

## 2023-04-02 ENCOUNTER — Encounter: Payer: Self-pay | Admitting: Cardiology

## 2023-04-02 ENCOUNTER — Ambulatory Visit: Payer: Medicare Other | Attending: Cardiology

## 2023-04-02 ENCOUNTER — Ambulatory Visit: Payer: Medicare Other | Attending: Cardiology | Admitting: Cardiology

## 2023-04-02 VITALS — BP 143/76 | HR 79 | Ht 62.6 in | Wt 131.6 lb

## 2023-04-02 DIAGNOSIS — R002 Palpitations: Secondary | ICD-10-CM

## 2023-04-02 DIAGNOSIS — E785 Hyperlipidemia, unspecified: Secondary | ICD-10-CM | POA: Diagnosis not present

## 2023-04-02 DIAGNOSIS — R011 Cardiac murmur, unspecified: Secondary | ICD-10-CM | POA: Diagnosis not present

## 2023-04-02 NOTE — Patient Instructions (Signed)
FDA-cleared personal EKG: The world's most clinically validated personal EKG, FDA-cleared to detect Atrial Fibrillation, Bradycardia, and Tachycardia. Mayford Knife is the most reliable way to check in on your heart from home. Take your EKG from anywhere: Capture a medical-grade EKG in 30 seconds and get an instant analysis right on your smartphone. Mayford Knife is small enough to fit in your pocket, so you can take it with you anywhere. Easy to use: Simply place your fingers on the sensors--no wires, patches, or gels. Recommended by doctors: A trusted resource, Mayford Knife is the #1 doctor-recommended personal EKG with more than 100 million EKGs recorded. Save or share your EKGs: With the press of a button, email your EKGs to your doctor or save them on your phone. Works with smartphones: Compatible with Event organiser and tablets. Check our compatibility chart. FSA/HSA eligible: Purchase using an FSA or HSA account (please confirm coverage with your insurance provider). Phone clip included with purchase, a $15 value. Conveniently take your device with you wherever you go.  [email or web address]   Step One- Record your EKG strip on [facility] app.   Step two- On K[facility]ia EKG click "Download"   Step three- It will prompt you to make a password for this EKG. Please make the password "Revankar" so that we can view it.   Step four- Click on the little "upload" button (small box with an arrow in the middle) in the bottom left-hand corner of the screen.   Step five- Click "Save to Files"  Step six- Click on "On my iphone" and then "Pages" then press save in the top right-hand corner.   NOW GO TO MYCHART   Once on MyChart click "Messages"  Step one- Click "Send a message"  Step two- Click "Ask a medical question"   Step three- Click "Non urgent medical question"   Step four- Click on Rajan Revankar's name.  Step five- Click on the small  paperclip at the bottom of the screen  Step six- Click "Choose file"  Step seven- Pick the most recent EKG strip listed.   Once uploaded send the message!  Medication Instructions:  Your physician recommends that you continue on your current medications as directed. Please refer to the Current Medication list given to you today.  *If you need a refill on your cardiac medications before your next appointment, please call your pharmacy*   Lab Work: None ordered If you have labs (blood work) drawn today and your tests are completely normal, you will receive your results only by: MyChart Message (if you have MyChart) OR A paper copy in the mail If you have any lab test that is abnormal or we need to change your treatment, we will call you to review the results.   Testing/Procedures: Your physician has requested that you have an echocardiogram. Echocardiography is a painless test that uses sound waves to create images of your heart. It provides your doctor with information about the size and shape of your heart and how well your heart's chambers and valves are working. This procedure takes approximately one hour. There are no restrictions for this procedure. Please do NOT wear cologne, perfume, aftershave, or lotions (deodorant is allowed). Please arrive 15 minutes prior to your appointment time.  Your physician has recommended that you wear an event monitor. Event monitors are medical devices that record the heart's electrical activity. Doctors most often [place] these monitors to diagnose arrhythmias. Arrhythmias are problems with the speed or rhythm of the heartbeat. The monitor  is a small, portable device. You can wear one while you do your normal daily activities. This is usually used to diagnose what is causing palpitations/syncope (passing out).  Follow-Up: At [facility], you and your health needs are our priority.  As part of our continuing mission to provide you with exceptional  heart care, we have created designated Provider Care Teams.  These Care Teams include your primary Cardiologist (physician) and Advanced Practice Providers (APPs -  Physician Assistants and Nurse Practitioners) who all work together to provide you with the care you need, when you need it.  We recommend signing up for the patient portal called "MyChart".  Sign up information is provided on this After Visit Summary.  MyChart is used to connect with patients for Virtual Visits (Telemedicine).  Patients are able to view lab/test results, encounter notes, upcoming appointments, etc.  Non-urgent messages can be sent to your provider as well.   To learn more about what you can do with MyChart, go to [email or web address].    Your next appointment:   9 month(s)  The format for your next appointment:   In Person  Provider:   Belva Crome, MD   Other Instructions Echocardiogram An echocardiogram is a test that uses sound waves (ultrasound) to produce images of the heart. Images from an echocardiogram can provide important information about: Heart size and shape. The size and thickness and movement of your heart's walls. Heart muscle function and strength. Heart valve function or if you have stenosis. Stenosis is when the heart valves are too narrow. If blood is flowing backward through the heart valves (regurgitation). A tumor or infectious growth around the heart valves. Areas of heart muscle that are not working well because of poor blood flow or injury from a heart attack. Aneurysm detection. An aneurysm is a weak or damaged part of an artery wall. The wall bulges out from the normal force of blood pumping through the body. Tell a health care provider about: Any allergies you have. All medicines you are taking, including vitamins, herbs, eye drops, creams, and over-the-counter medicines. Any blood disorders you have. Any surgeries you have had. Any medical conditions you have. Whether  you are pregnant or may be pregnant. What are the risks? Generally, this is a safe test. However, problems may occur, including an allergic reaction to dye (contrast) that may be used during the test. What happens before the test? No specific preparation is needed. You may eat and drink normally. What happens during the test? You will take off your clothes from the waist up and put on a hospital gown. Electrodes or electrocardiogram (ECG)patches may be placed on your chest. The electrodes or patches are then connected to a device that monitors your heart rate and rhythm. You will lie down on a table for an ultrasound exam. A gel will be applied to your chest to help sound waves pass through your skin. A handheld device, called a transducer, will be pressed against your chest and moved over your heart. The transducer produces sound waves that travel to your heart and bounce back (or "echo" back) to the transducer. These sound waves will be captured in real-time and changed into images of your heart that can be viewed on a video monitor. The images will be recorded on a computer and reviewed by your health care provider. You may be asked to change positions or hold your breath for a short time. This makes it easier to get different views or better  views of your heart. In some cases, you may receive contrast through an IV in one of your veins. This can improve the quality of the pictures from your heart. The procedure may vary among health care providers and hospitals.   What can I expect after the test? You may return to your normal, everyday life, including diet, activities, and medicines, unless your health care provider tells you not to do that. Follow these instructions at home: It is up to you to get the results of your test. Ask your health care provider, or the department that is doing the test, when your results will be ready. Keep all follow-up visits. This is important. Summary An  echocardiogram is a test that uses sound waves (ultrasound) to produce images of the heart. Images from an echocardiogram can provide important information about the size and shape of your heart, heart muscle function, heart valve function, and other possible heart problems. You do not need to do anything to prepare before this test. You may eat and drink normally. After the echocardiogram is completed, you may return to your normal, everyday life, unless your health care provider tells you not to do that. This information is not intended to replace advice given to you by your health care provider. Make sure you discuss any questions you have with your health care provider. Document Revised: 06/29/2020 Document Reviewed: 06/29/2020 Elsevier Patient Education  2021 [employer].   Important Information About Sugar

## 2023-04-02 NOTE — Progress Notes (Signed)
Cardiology Office Note:    Date:  04/02/2023   ID:  Allison Zavala, DOB 07-23-1946, MRN [id number]  PCP:  Allison Lei, MD  Cardiologist:  Allison Brothers, MD   Referring MD: Allison Lei, MD    ASSESSMENT:    1. Murmur, cardiac   2. Palpitations   3. Dyslipidemia    PLAN:    In order of problems listed above:  Primary prevention stressed with the patient.  Importance of compliance with diet medication stressed and patient verbalized standing.  Patient was advised to walk at least half an hour a day on a regular basis. Palpitations: I noted blood work report and office records from primary care.  Her TSH is normal.  Will try to get a 1 month monitor for her.  If this is negative educated about self monitoring with cardia and she understands and will proceed with this as necessary.  She has not had any dizziness or palpitations that are associated with syncope.  I educated her about this also. Cardiac murmur: Echocardiogram will be done to assess murmur heard on auscultation. Mixed dyslipidemia: On lipid-lowering medications followed by primary care.  Diet emphasized. Patient will be seen in follow-up appointment in 6 months or earlier if the patient has any concerns.    Medication Adjustments/Labs and Tests Ordered: Current medicines are reviewed at length with the patient today.  Concerns regarding medicines are outlined above.  Orders Placed This Encounter  Procedures   Cardiac event monitor   EKG 12-Lead   ECHOCARDIOGRAM COMPLETE   No orders of the defined types were placed in this encounter.    History of Present Illness:    Allison Zavala is a 77 y.o. female who is being seen today for the evaluation of palpitations at the request of Allison Lei, MD. patient is a pleasant 77 year old female.  She has past medical history of mixed dyslipidemia.  She is an active lady.  She mentions to me that she had an episode of palpitations which drains are out.  It lasts about an  hour.  This happens once a month.  No chest pain orthopnea or PND.  She has been told to have a cardiac murmur in the past.  She is an active lady and exertion does not bring around any symptoms of chest pain or any such issues.  At the time of my evaluation, the patient is alert awake oriented and in no distress.  Past Medical History:  Diagnosis Date   Allergic rhinosinusitis    Anxiety disorder    BMI 23.0-23.9, adult    Dyslipidemia    Fatigue    Gingivostomatitis    High cholesterol    History of cardiac murmur    Hot flash, menopausal    Hymenoptera allergy    Hypertension    Hypothyroidism    Hypothyroidism, adult    Insertional Achilles tendinopathy 04/13/2021   Insomnia    Multiple allergies    Pernio, subsequent encounter 12/13/2017   Post-menopausal atrophic vaginitis    Raynaud phenomenon    Raynaud's syndrome without gangrene 12/13/2017   Sacrococcygeal pain    SOB (shortness of breath) on exertion    Vitamin D deficiency     Past Surgical History:  Procedure Laterality Date   ABDOMINAL HYSTERECTOMY     ADENOIDECTOMY     LAPAROSCOPY     X 3   OTHER SURGICAL HISTORY     Pelvic repairs abdominal X 2   REFRACTIVE SURGERY  TONSILLECTOMY      Current Medications: Current Meds  Medication Sig   albuterol (VENTOLIN HFA) 108 (90 Base) MCG/ACT inhaler INHALE 2 PUFF INTO THE LUNGS EVERY 4 TO 6 HOURS AS NEEDED FOR COUGH OR WHEEZE   atorvastatin (LIPITOR) 80 MG tablet Take 80 mg by mouth daily.   EPINEPHrine 0.3 mg/0.3 mL IJ SOAJ injection Use as directed for life-threatening allergic reaction.   estradiol (ESTRACE) 0.5 MG tablet Take 0.5 mg by mouth daily.   famotidine (PEPCID) 40 MG tablet Take 40 mg by mouth daily.   fluticasone (FLONASE) 50 MCG/ACT nasal spray SHAKE LIQUID AND USE 1 TO 2 SPRAYS IN EACH NOSTRIL DAILY   levothyroxine (SYNTHROID) 75 MCG tablet Take 75 mcg by mouth daily before breakfast.   montelukast (SINGULAIR) 10 MG tablet Take one tablet by  mouth once daily at bedtime.   nitroGLYCERIN (NITROSTAT) 0.4 MG SL tablet Place 0.4 mg under the tongue every 5 (five) minutes as needed for chest pain.   omeprazole (PRILOSEC) 40 MG capsule TAKE 1 CAPSULE BY MOUTH TWICE DAILY   sertraline (ZOLOFT) 100 MG tablet Take 100 mg by mouth daily.   triamcinolone (KENALOG) 0.1 % paste Use as directed 1 Application in the mouth or throat 2 (two) times daily.   Vitamin D, Ergocalciferol, (DRISDOL) 1.25 MG (50000 UNIT) CAPS capsule Take 50,000 Units by mouth every 7 (seven) days.   zolpidem (AMBIEN) 10 MG tablet Take 5-10 mg by mouth at bedtime.     Allergies:   Bee venom, Azithromycin, Cetirizine, and Demerol [meperidine hcl]   Social History   Socioeconomic History   Marital status: Divorced    Spouse name: Not on file   Number of children: Not on file   Years of education: Not on file   Highest education level: Not on file  Occupational History   Not on file  Tobacco Use   Smoking status: Never   Smokeless tobacco: Never  Substance and Sexual Activity   Alcohol use: Yes   Drug use: Never   Sexual activity: Not on file  Other Topics Concern   Not on file  Social History Narrative   Not on file   Social Determinants of Health   Financial Resource Strain: Not on file  Food Insecurity: Not on file  Transportation Needs: Not on file  Physical Activity: Not on file  Stress: Not on file  Social Connections: Not on file     Family History: The patient's family history includes Alzheimer's disease in her mother; Lung cancer in her father; Stroke in her mother.  ROS:   Please see the history of present illness.    All other systems reviewed and are negative.  EKGs/Labs/Other Studies Reviewed:    The following studies were reviewed today: EKG reveals sinus rhythm and nonspecific ST-T changes   Recent Labs: No results found for requested labs within last 365 days.  Recent Lipid Panel No results found for: "CHOL", "TRIG", "HDL",  "CHOLHDL", "VLDL", "LDLCALC", "LDLDIRECT"  Physical Exam:    VS:  BP (!) 143/76   Pulse 79   Ht 5' 2.6" (1.59 m)   Wt 131 lb 9.6 oz (59.7 kg)   SpO2 99%   BMI 23.61 kg/m     Wt Readings from Last 3 Encounters:  04/02/23 131 lb 9.6 oz (59.7 kg)  06/26/22 135 lb 9.6 oz (61.5 kg)  06/01/22 135 lb (61.2 kg)     GEN: Patient is in no acute distress HEENT: Normal NECK:  No JVD; No carotid bruits LYMPHATICS: No lymphadenopathy CARDIAC: S1 S2 regular, 2/6 systolic murmur at the apex. RESPIRATORY:  Clear to auscultation without rales, wheezing or rhonchi  ABDOMEN: Soft, non-tender, non-distended MUSCULOSKELETAL:  No edema; No deformity  SKIN: Warm and dry NEUROLOGIC:  Alert and oriented x 3 PSYCHIATRIC:  Normal affect    Signed, Allison Brothers, MD  04/02/2023 2:07 PM     [facility]

## 2023-04-04 DIAGNOSIS — G4700 Insomnia, unspecified: Secondary | ICD-10-CM | POA: Diagnosis not present

## 2023-04-04 DIAGNOSIS — F419 Anxiety disorder, unspecified: Secondary | ICD-10-CM | POA: Diagnosis not present

## 2023-04-04 DIAGNOSIS — Z87892 Personal history of anaphylaxis: Secondary | ICD-10-CM | POA: Diagnosis not present

## 2023-04-04 DIAGNOSIS — R002 Palpitations: Secondary | ICD-10-CM | POA: Diagnosis not present

## 2023-04-06 ENCOUNTER — Other Ambulatory Visit: Payer: Medicare Other

## 2023-04-10 ENCOUNTER — Telehealth: Payer: Self-pay | Admitting: Cardiology

## 2023-04-10 NOTE — Telephone Encounter (Signed)
Patient stating she had a monitor on and has broken out in a rash, she is allergic to the adhesive.  She wants to know if we have a different type of adhesive in the office she can pick to be able to use the monitor.

## 2023-04-10 NOTE — Telephone Encounter (Signed)
Advised to call [employer] for recommendations. Pt verbalized understanding and had no additional questions.

## 2023-04-24 ENCOUNTER — Other Ambulatory Visit: Payer: Medicare Other

## 2023-04-25 DIAGNOSIS — N390 Urinary tract infection, site not specified: Secondary | ICD-10-CM | POA: Diagnosis not present

## 2023-05-01 DIAGNOSIS — N390 Urinary tract infection, site not specified: Secondary | ICD-10-CM | POA: Diagnosis not present

## 2023-05-14 DIAGNOSIS — J020 Streptococcal pharyngitis: Secondary | ICD-10-CM | POA: Diagnosis not present

## 2023-05-14 DIAGNOSIS — I10 Essential (primary) hypertension: Secondary | ICD-10-CM | POA: Diagnosis not present

## 2023-05-14 DIAGNOSIS — Z6823 Body mass index (BMI) 23.0-23.9, adult: Secondary | ICD-10-CM | POA: Diagnosis not present

## 2023-05-14 DIAGNOSIS — G4700 Insomnia, unspecified: Secondary | ICD-10-CM | POA: Diagnosis not present

## 2023-05-15 ENCOUNTER — Ambulatory Visit: Payer: Medicare Other

## 2023-05-23 ENCOUNTER — Other Ambulatory Visit: Payer: Self-pay | Admitting: Allergy and Immunology

## 2023-06-17 ENCOUNTER — Other Ambulatory Visit: Payer: Self-pay | Admitting: Allergy and Immunology

## 2023-06-20 ENCOUNTER — Ambulatory Visit (INDEPENDENT_AMBULATORY_CARE_PROVIDER_SITE_OTHER): Payer: Medicare Other | Admitting: Allergy and Immunology

## 2023-06-20 ENCOUNTER — Encounter: Payer: Self-pay | Admitting: Allergy and Immunology

## 2023-06-20 VITALS — BP 138/84 | HR 88 | Resp 20 | Ht 62.0 in | Wt 132.6 lb

## 2023-06-20 DIAGNOSIS — T7840XD Allergy, unspecified, subsequent encounter: Secondary | ICD-10-CM | POA: Diagnosis not present

## 2023-06-20 DIAGNOSIS — J3089 Other allergic rhinitis: Secondary | ICD-10-CM

## 2023-06-20 DIAGNOSIS — T782XXD Anaphylactic shock, unspecified, subsequent encounter: Secondary | ICD-10-CM

## 2023-06-20 DIAGNOSIS — T63484D Toxic effect of venom of other arthropod, undetermined, subsequent encounter: Secondary | ICD-10-CM | POA: Diagnosis not present

## 2023-06-20 DIAGNOSIS — T7840XA Allergy, unspecified, initial encounter: Secondary | ICD-10-CM | POA: Diagnosis not present

## 2023-06-20 DIAGNOSIS — K219 Gastro-esophageal reflux disease without esophagitis: Secondary | ICD-10-CM

## 2023-06-20 DIAGNOSIS — L299 Pruritus, unspecified: Secondary | ICD-10-CM | POA: Diagnosis not present

## 2023-06-20 MED ORDER — METHYLPREDNISOLONE ACETATE 80 MG/ML IJ SUSP
80.0000 mg | Freq: Once | INTRAMUSCULAR | Status: AC
Start: 2023-06-20 — End: 2023-06-20
  Administered 2023-06-20: 80 mg via INTRAMUSCULAR

## 2023-06-20 MED ORDER — EPINEPHRINE 0.3 MG/0.3ML IJ SOAJ: INTRAMUSCULAR | 3 refills | Status: DC

## 2023-06-20 MED ORDER — ALBUTEROL SULFATE HFA 108 (90 BASE) MCG/ACT IN AERS: INHALATION_SPRAY | RESPIRATORY_TRACT | 1 refills | Status: DC

## 2023-06-20 NOTE — Progress Notes (Unsigned)
[place] [facility] [place] - [place] [facility] [place] -    Follow-up Note  Referring Provider: Lucianne Lei, MD Primary Provider: Lucianne Lei, MD Date of Office Visit: 06/20/2023  Subjective:   Allison Zavala (DOB: 07/27/1946) is a 77 y.o. female who [facility] in re-evaluation of the following:  HPI: Dezarai returns to this clinic in ration of allergic reaction/urticaria/pruritic disorder, allergic rhinitis, LPR, distant history of asthma, history of hymenoptera venom hypersensitivity state.  I last saw her in this clinic 18 September 2022.  She was doing quite well since her last visit without any issues involving her skin but unfortunately over the course of the past 5 weeks she has been diffusely itchy and she has been having some intermittent red spots come up on her body without any other associated systemic or constitutional symptoms.  Even though she has been using her Claritin-D and famotidine and montelukast on a consistent basis she has developed these problems.  She has not really had an obvious change in her environment or started any new supplements or started any new foods and has no symptoms to suggest an ongoing infectious disease.  However, she has had a new cat introduced into the house at the end of May 2024.  She already had 1 cat that lives inside the household.  Her reflux is good at this point in time.  She continues on omeprazole and as noted above is using famotidine as well.  She has had very little problems with her upper airways.  She tells me she had to use an EpiPen for she was stung by some insect and developed shortness of breath and itchiness all over.  Allergies as of 06/20/2023       Reactions   Bee Venom Anaphylaxis, Hives, Itching, Shortness Of Breath, Swelling   Azithromycin Hives   Cetirizine    Demerol [meperidine Hcl] Nausea And Vomiting        Medication List    albuterol 108 (90 Base) MCG/ACT  inhaler Commonly known as: VENTOLIN HFA INHALE 2 PUFF INTO THE LUNGS EVERY 4 TO 6 HOURS AS NEEDED FOR COUGH OR WHEEZE   atorvastatin 80 MG tablet Commonly known as: LIPITOR Take 80 mg by mouth daily.   EPINEPHrine 0.3 mg/0.3 mL Soaj injection Commonly known as: EPI-PEN Use as directed for life-threatening allergic reaction.   estradiol 0.5 MG tablet Commonly known as: ESTRACE Take 0.5 mg by mouth daily.   famotidine 40 MG tablet Commonly known as: PEPCID Take 40 mg by mouth daily.   fluticasone 50 MCG/ACT nasal spray Commonly known as: FLONASE SHAKE LIQUID AND USE 1 TO 2 SPRAYS IN EACH NOSTRIL DAILY   levothyroxine 75 MCG tablet Commonly known as: SYNTHROID Take 75 mcg by mouth daily before breakfast.   montelukast 10 MG tablet Commonly known as: SINGULAIR Take one tablet by mouth once daily at bedtime.   nitroGLYCERIN 0.4 MG SL tablet Commonly known as: NITROSTAT Place 0.4 mg under the tongue every 5 (five) minutes as needed for chest pain.   omeprazole 20 MG capsule Commonly known as: PRILOSEC Take 20 mg by mouth daily.   sertraline 100 MG tablet Commonly known as: ZOLOFT Take 100 mg by mouth daily.   Vitamin D (Ergocalciferol) 1.25 MG (50000 UNIT) Caps capsule Commonly known as: DRISDOL Take 50,000 Units by mouth every 7 (seven) days.   zolpidem 10 MG tablet Commonly known as: AMBIEN Take 5-10 mg by mouth at bedtime.  Past Medical History:  Diagnosis Date   Allergic rhinosinusitis    Anxiety disorder    BMI 23.0-23.9, adult    Dyslipidemia    Fatigue    Gingivostomatitis    High cholesterol    History of cardiac murmur    Hot flash, menopausal    Hymenoptera allergy    Hypertension    Hypothyroidism    Hypothyroidism, adult    Insertional Achilles tendinopathy 04/13/2021   Insomnia    Multiple allergies    Pernio, subsequent encounter 12/13/2017   Post-menopausal atrophic vaginitis    Raynaud phenomenon    Raynaud's syndrome without  gangrene 12/13/2017   Sacrococcygeal pain    SOB (shortness of breath) on exertion    Vitamin D deficiency     Past Surgical History:  Procedure Laterality Date   ABDOMINAL HYSTERECTOMY     ADENOIDECTOMY     LAPAROSCOPY     X 3   OTHER SURGICAL HISTORY     Pelvic repairs abdominal X 2   REFRACTIVE SURGERY     TONSILLECTOMY      Review of systems negative except as noted in HPI / PMHx or noted below:  Review of Systems  Constitutional: Negative.   HENT: Negative.    Eyes: Negative.   Respiratory: Negative.    Cardiovascular: Negative.   Gastrointestinal: Negative.   Genitourinary: Negative.   Musculoskeletal: Negative.   Skin: Negative.   Neurological: Negative.   Endo/Heme/Allergies: Negative.   Psychiatric/Behavioral: Negative.       Objective:   Vitals:   06/20/23 1058  BP: 138/84  Pulse: 88  Resp: 20  SpO2: 99%   Height: 5\' 2"  (157.5 cm)  Weight: 132 lb 9.6 oz (60.1 kg)   Physical Exam Constitutional:      Appearance: She is not diaphoretic.  HENT:     Head: Normocephalic.     Right Ear: Tympanic membrane, ear canal and external ear normal.     Left Ear: Tympanic membrane, ear canal and external ear normal.     Nose: Nose normal. No mucosal edema or rhinorrhea.     Mouth/Throat:     Pharynx: Uvula midline. No oropharyngeal exudate.  Eyes:     Conjunctiva/sclera: Conjunctivae normal.  Neck:     Thyroid: No thyromegaly.     Trachea: Trachea normal. No tracheal tenderness or tracheal deviation.  Cardiovascular:     Rate and Rhythm: Normal rate and regular rhythm.     Heart sounds: Normal heart sounds, S1 normal and S2 normal. No murmur heard. Pulmonary:     Effort: No respiratory distress.     Breath sounds: Normal breath sounds. No stridor. No wheezing or rales.  Lymphadenopathy:     Head:     Right side of head: No tonsillar adenopathy.     Left side of head: No tonsillar adenopathy.     Cervical: No cervical adenopathy.  Skin:    Findings:  No erythema or rash.     Nails: There is no clubbing.  Neurological:     Mental Status: She is alert.     Diagnostics: none  Assessment and Plan:   1. Allergic reaction, subsequent encounter   2. Pruritic disorder   3. Perennial allergic rhinitis   4. LPRD (laryngopharyngeal reflux disease)   5. Anaphylaxis due to hymenoptera venom, undetermined intent, subsequent encounter    1.  Claritin 10 mg (NOT "D") - 2 tablets 2 times per day  2.  Famotidine 20 mg - 1  tablet 2 times per day  2.  Omeprazole 40 mg - 1 tablet 2 times per day  3.  Montelukast 10 mg - 1 tablet 1 time per day  4.  Flonase - 1-2 sprays each nostril 1 time per day  5.  Epi-Pen if needed.   6.  Albuterol HFA - 2 inhalations every 4-6 hours if needed  7.  Depomedrol 80 mg IM delivered in clinic today  8. Blood - CBC w/d, CMP, TSH, FT4, thyroid peroxidase antibody, alpha-gal panel, tryptase  9. Return to clinic in 4 weeks  10. Immunotherapy for venom allergy???   Eliyah appears to have some type of immune activation with unknown etiologic factor.  This is a similar event to what she had last year and we thought this would Virgel Bouquet is going to be an isolated event but obviously this is recurrent.  Will now have her undergo further evaluation for systemic disease contributing to her issue as noted above and we will have her use Claritin and famotidine and montelukast on a consistent basis and I have given her an injection of a systemic steroid today.  I will see her back in this clinic in 4 weeks.  Laurette Schimke, MD Allergy / Immunology [facility]

## 2023-06-20 NOTE — Patient Instructions (Addendum)
  1.  Claritin 10 mg (NOT "D") - 2 tablets 2 times per day  2.  Famotidine 20 mg - 1 tablet 2 times per day  2.  Omeprazole 40 mg - 1 tablet 2 times per day  3.  Montelukast 10 mg - 1 tablet 1 time per day  4.  Flonase - 1-2 sprays each nostril 1 time per day  5.  Epi-Pen if needed.   6.  Albuterol HFA - 2 inhalations every 4-6 hours if needed  7.  Depomedrol 80 mg IM delivered in clinic today  8. Blood - CBC w/d, CMP, TSH, FT4, thyroid peroxidase antibody, alpha-gal panel, tryptase  9. Return to clinic in 4 weeks  10. Immunotherapy for venom allergy???

## 2023-06-21 ENCOUNTER — Encounter: Payer: Self-pay | Admitting: Allergy and Immunology

## 2023-06-25 DIAGNOSIS — I7300 Raynaud's syndrome without gangrene: Secondary | ICD-10-CM | POA: Diagnosis not present

## 2023-06-25 DIAGNOSIS — G4700 Insomnia, unspecified: Secondary | ICD-10-CM | POA: Diagnosis not present

## 2023-06-25 DIAGNOSIS — Z889 Allergy status to unspecified drugs, medicaments and biological substances status: Secondary | ICD-10-CM | POA: Diagnosis not present

## 2023-06-25 DIAGNOSIS — Z780 Asymptomatic menopausal state: Secondary | ICD-10-CM | POA: Diagnosis not present

## 2023-06-27 ENCOUNTER — Ambulatory Visit (INDEPENDENT_AMBULATORY_CARE_PROVIDER_SITE_OTHER): Payer: Medicare Other | Admitting: *Deleted

## 2023-06-27 DIAGNOSIS — T63484D Toxic effect of venom of other arthropod, undetermined, subsequent encounter: Secondary | ICD-10-CM

## 2023-06-27 DIAGNOSIS — T782XXD Anaphylactic shock, unspecified, subsequent encounter: Secondary | ICD-10-CM | POA: Diagnosis not present

## 2023-06-27 NOTE — Progress Notes (Unsigned)
Immunotherapy   Patient Details  Name: RISA BALLI MRN: [id number] Date of Birth: 03-30-1946  06/27/2023  [name] started injections for MIXED VESPID 0.003 & WASP 0.001  Frequency:1 time per week Epi-Pen:Epi-Pen Available  Consent signed and patient instructions given. No issues after observed wait time.   Redge Gainer 06/27/2023, 2:01 PM

## 2023-07-04 ENCOUNTER — Ambulatory Visit (INDEPENDENT_AMBULATORY_CARE_PROVIDER_SITE_OTHER): Payer: Medicare Other | Admitting: *Deleted

## 2023-07-04 DIAGNOSIS — T63484D Toxic effect of venom of other arthropod, undetermined, subsequent encounter: Secondary | ICD-10-CM | POA: Diagnosis not present

## 2023-07-04 DIAGNOSIS — T782XXD Anaphylactic shock, unspecified, subsequent encounter: Secondary | ICD-10-CM | POA: Diagnosis not present

## 2023-07-11 ENCOUNTER — Ambulatory Visit (INDEPENDENT_AMBULATORY_CARE_PROVIDER_SITE_OTHER): Payer: Medicare Other

## 2023-07-11 DIAGNOSIS — T63484D Toxic effect of venom of other arthropod, undetermined, subsequent encounter: Secondary | ICD-10-CM | POA: Diagnosis not present

## 2023-07-11 DIAGNOSIS — T782XXD Anaphylactic shock, unspecified, subsequent encounter: Secondary | ICD-10-CM | POA: Diagnosis not present

## 2023-07-12 ENCOUNTER — Other Ambulatory Visit: Payer: Self-pay | Admitting: Allergy and Immunology

## 2023-07-18 ENCOUNTER — Ambulatory Visit (INDEPENDENT_AMBULATORY_CARE_PROVIDER_SITE_OTHER): Payer: Medicare Other | Admitting: *Deleted

## 2023-07-18 DIAGNOSIS — T63484D Toxic effect of venom of other arthropod, undetermined, subsequent encounter: Secondary | ICD-10-CM | POA: Diagnosis not present

## 2023-07-18 DIAGNOSIS — T782XXD Anaphylactic shock, unspecified, subsequent encounter: Secondary | ICD-10-CM

## 2023-07-25 ENCOUNTER — Encounter: Payer: Self-pay | Admitting: Allergy and Immunology

## 2023-07-25 ENCOUNTER — Ambulatory Visit: Payer: 59

## 2023-07-25 ENCOUNTER — Ambulatory Visit (INDEPENDENT_AMBULATORY_CARE_PROVIDER_SITE_OTHER): Payer: Medicare Other | Admitting: Allergy and Immunology

## 2023-07-25 VITALS — BP 118/72 | HR 72 | Resp 16

## 2023-07-25 DIAGNOSIS — L500 Allergic urticaria: Secondary | ICD-10-CM

## 2023-07-25 DIAGNOSIS — Z91018 Allergy to other foods: Secondary | ICD-10-CM

## 2023-07-25 DIAGNOSIS — T63484D Toxic effect of venom of other arthropod, undetermined, subsequent encounter: Secondary | ICD-10-CM

## 2023-07-25 DIAGNOSIS — T782XXD Anaphylactic shock, unspecified, subsequent encounter: Secondary | ICD-10-CM

## 2023-07-25 DIAGNOSIS — J3089 Other allergic rhinitis: Secondary | ICD-10-CM | POA: Diagnosis not present

## 2023-07-25 DIAGNOSIS — L299 Pruritus, unspecified: Secondary | ICD-10-CM

## 2023-07-25 DIAGNOSIS — T63481D Toxic effect of venom of other arthropod, accidental (unintentional), subsequent encounter: Secondary | ICD-10-CM

## 2023-07-25 DIAGNOSIS — K219 Gastro-esophageal reflux disease without esophagitis: Secondary | ICD-10-CM | POA: Diagnosis not present

## 2023-07-25 NOTE — Progress Notes (Unsigned)
[place] - [place] - [place] - [place] - [place]   Follow-up Note  Referring Provider: Lucianne Lei, MD Primary Provider: Lucianne Lei, MD Date of Office Visit: 07/25/2023  Subjective:   Allison Zavala (DOB: [birth year]) is a 77 y.o. female who [facility] in re-evaluation of the following:  HPI: Maribi returns to this clinic in evaluation of recurrent urticaria, pruritus, allergic reaction, and a history of allergic rhinitis and LPR and hymenoptera venom hypersensitivity state.  I last saw her in this clinic 20 June 2023.  When I last saw her in this clinic she had a very acute exacerbation of her immune activation with lots of pruritus and urticaria.  We started her on a program of therapy and investigated her for various etiologic agents responsible for this issue.  She has been consistently using an H1 and H2 receptor blocker and a leukotriene modifier and she has avoided all mammal consumption.  She is much better at this point in time and really has very little problems with her skin.  There were a few occasions where she had exposure to eucalyptus plant and some chlorine vapors and this triggered a very short episode of hives but otherwise has done very well.  She has started immunotherapy directed against hymenoptera venom.  Her reflux is under very good control.  Allergies as of 07/25/2023       Reactions   Bee Venom Anaphylaxis, Hives, Itching, Shortness Of Breath, Swelling   Azithromycin Hives   Cetirizine    Demerol [meperidine Hcl] Nausea And Vomiting        Medication List    albuterol 108 (90 Base) MCG/ACT inhaler Commonly known as: VENTOLIN HFA CAN INHALE 2 PUFF INTO THE LUNGS EVERY 4 TO 6 HOURS AS NEEDED FOR COUGH OR WHEEZE   APPLE CIDER VINEGAR PO Take by mouth daily.   atorvastatin 80 MG tablet Commonly known as: LIPITOR Take 80 mg by mouth daily.   EPINEPHrine 0.3 mg/0.3 mL Soaj injection Commonly  known as: EPI-PEN Use as directed for life-threatening allergic reaction.   estradiol 0.5 MG tablet Commonly known as: ESTRACE Take 0.5 mg by mouth daily.   famotidine 40 MG tablet Commonly known as: PEPCID Take 40 mg by mouth daily.   fluticasone 50 MCG/ACT nasal spray Commonly known as: FLONASE SHAKE LIQUID AND USE 1 TO 2 SPRAYS IN EACH NOSTRIL DAILY   levothyroxine 75 MCG tablet Commonly known as: SYNTHROID Take 75 mcg by mouth daily before breakfast.   loratadine 10 MG tablet Commonly known as: CLARITIN Take 10 mg by mouth 2 (two) times daily.   montelukast 10 MG tablet Commonly known as: SINGULAIR Take one tablet by mouth once daily at bedtime.   MULTIVITAMIN PO Take by mouth daily.   nitroGLYCERIN 0.4 MG SL tablet Commonly known as: NITROSTAT Place 0.4 mg under the tongue every 5 (five) minutes as needed for chest pain.   omeprazole 20 MG capsule Commonly known as: PRILOSEC Take 20 mg by mouth daily.   SALMON OIL PO Take by mouth daily.   sertraline 100 MG tablet Commonly known as: ZOLOFT Take 100 mg by mouth daily.   VITAMIN C PO Take by mouth daily.   Vitamin D (Ergocalciferol) 1.25 MG (50000 UNIT) Caps capsule Commonly known as: DRISDOL Take 50,000 Units by mouth every 7 (seven) days.   zolpidem 10 MG tablet Commonly known as: AMBIEN Take 5-10 mg by mouth at bedtime.  Past Medical History:  Diagnosis Date   Allergic rhinosinusitis    Anxiety disorder    BMI 23.0-23.9, adult    Dyslipidemia    Fatigue    Gingivostomatitis    High cholesterol    History of cardiac murmur    Hot flash, menopausal    Hymenoptera allergy    Hypertension    Hypothyroidism    Hypothyroidism, adult    Insertional Achilles tendinopathy 04/13/2021   Insomnia    Multiple allergies    Pernio, subsequent encounter 12/13/2017   Post-menopausal atrophic vaginitis    Raynaud phenomenon    Raynaud's syndrome without gangrene 12/13/2017   Sacrococcygeal pain     SOB (shortness of breath) on exertion    Vitamin D deficiency     Past Surgical History:  Procedure Laterality Date   ABDOMINAL HYSTERECTOMY     ADENOIDECTOMY     LAPAROSCOPY     X 3   OTHER SURGICAL HISTORY     Pelvic repairs abdominal X 2   REFRACTIVE SURGERY     TONSILLECTOMY      Review of systems negative except as noted in HPI / PMHx or noted below:  Review of Systems  Constitutional: Negative.   HENT: Negative.    Eyes: Negative.   Respiratory: Negative.    Cardiovascular: Negative.   Gastrointestinal: Negative.   Genitourinary: Negative.   Musculoskeletal: Negative.   Skin: Negative.   Neurological: Negative.   Endo/Heme/Allergies: Negative.   Psychiatric/Behavioral: Negative.       Objective:   Vitals:   07/25/23 1119  BP: 118/72  Pulse: 72  Resp: 16  SpO2: 97%          Physical Exam Skin:    Findings: Rash (no urticaria) present.     Diagnostics: Results of blood tests obtained 20 June 2023 identified creatinine 0.86 Mg/DL, AST 16X/W, ALT 96E/A, WBC 7.3, absolute eosinophil 100, absolute lymphocyte 1900, hemoglobin 14.4, platelet 300, TSH 2.20 IU/mL, free T41.42 NG/DL, thyroid peroxidase antibody less than 9, alpha gal IgE 0.40 KU/L, tryptase 5.4 UG/L,  Assessment and Plan:   1. Pruritic disorder   2. Allergic urticaria   3. Food allergy   4. Perennial allergic rhinitis   5. LPRD (laryngopharyngeal reflux disease)   6. Anaphylaxis due to hymenoptera venom, accidental or unintentional, subsequent encounter    1.  Claritin 10 mg  - 1-2 tablets 2 times per day  2.  Famotidine 20 mg - 1 tablet 2 times per day  2.  Omeprazole 40 mg - 1 tablet 2 times per day  3.  Montelukast 10 mg - 1 tablet 1 time per day  4.  Flonase - 1-2 sprays each nostril 1 time per day  5.  Epi-Pen if needed.   6.  Albuterol HFA - 2 inhalations every 4-6 hours if needed  7.  Avoid mammal consumption  8. Epi-pen, benadryl, MD/ER evaluation for allergic  reaction  9. Continue immunotherapy for venom allergy  10. Plan for fall flu vaccine  11. Return to clinic late December 2024 or earlier if problem. Taper medications???  Hadlea appears to be doing a lot better while using a large collection of medical therapy directed against her overactive immune system and avoiding mammal consumption.  Will keep her on this plan and anticipate that we will be able to consolidate her treatment during her next visit in December 2024.  She will continue on immunotherapy for her venom allergy and also continue to address her issue  with LPR with the therapy noted above.  I will see her back in this clinic December 2024.  Laurette Schimke, MD Allergy / Immunology [facility]

## 2023-07-25 NOTE — Patient Instructions (Signed)
  1.  Claritin 10 mg  - 1-2 tablets 2 times per day  2.  Famotidine 20 mg - 1 tablet 2 times per day  2.  Omeprazole 40 mg - 1 tablet 2 times per day  3.  Montelukast 10 mg - 1 tablet 1 time per day  4.  Flonase - 1-2 sprays each nostril 1 time per day  5.  Epi-Pen if needed.   6.  Albuterol HFA - 2 inhalations every 4-6 hours if needed  7.  Avoid mammal consumption  8. Epi-pen, benadryl, MD/ER evaluation for allergic reaction  9. Continue immunotherapy for venom allergy  10. Plan for fall flu vaccine  11. Return to clinic late December 2024 or earlier if problem. Taper medications???

## 2023-07-26 ENCOUNTER — Telehealth: Payer: Self-pay | Admitting: Allergy and Immunology

## 2023-07-26 ENCOUNTER — Encounter: Payer: Self-pay | Admitting: Allergy and Immunology

## 2023-07-26 DIAGNOSIS — Z6823 Body mass index (BMI) 23.0-23.9, adult: Secondary | ICD-10-CM | POA: Diagnosis not present

## 2023-07-26 DIAGNOSIS — T7840XA Allergy, unspecified, initial encounter: Secondary | ICD-10-CM | POA: Diagnosis not present

## 2023-07-26 DIAGNOSIS — E559 Vitamin D deficiency, unspecified: Secondary | ICD-10-CM | POA: Diagnosis not present

## 2023-07-26 DIAGNOSIS — Z79899 Other long term (current) drug therapy: Secondary | ICD-10-CM | POA: Diagnosis not present

## 2023-07-26 DIAGNOSIS — Z889 Allergy status to unspecified drugs, medicaments and biological substances status: Secondary | ICD-10-CM | POA: Diagnosis not present

## 2023-07-26 MED ORDER — PREDNISONE 10 MG PO TABS: 10.0000 mg | ORAL_TABLET | Freq: Every day | ORAL | 0 refills | Status: AC

## 2023-07-26 NOTE — Telephone Encounter (Signed)
Prescription sent to [facility]. Lvm informing patient

## 2023-07-26 NOTE — Telephone Encounter (Signed)
Patient states she received her injection yesterday and last night she had a "wicked reaction". She was covered in hives all over her body, felt swelling and tingling. She used her Epi Pen and took Benadryl. She feels the reaction is coming back this morning. She is wondering if she's able to come in for a prednisone shot.

## 2023-07-27 NOTE — Telephone Encounter (Signed)
Unable to reach patient again. LVM

## 2023-08-01 ENCOUNTER — Ambulatory Visit (INDEPENDENT_AMBULATORY_CARE_PROVIDER_SITE_OTHER): Payer: Medicare Other | Admitting: *Deleted

## 2023-08-01 DIAGNOSIS — T63481D Toxic effect of venom of other arthropod, accidental (unintentional), subsequent encounter: Secondary | ICD-10-CM | POA: Diagnosis not present

## 2023-08-01 DIAGNOSIS — T782XXD Anaphylactic shock, unspecified, subsequent encounter: Secondary | ICD-10-CM | POA: Diagnosis not present

## 2023-08-08 ENCOUNTER — Ambulatory Visit: Payer: Medicare Other

## 2023-08-09 ENCOUNTER — Ambulatory Visit (INDEPENDENT_AMBULATORY_CARE_PROVIDER_SITE_OTHER): Payer: Medicare Other

## 2023-08-09 DIAGNOSIS — T63481D Toxic effect of venom of other arthropod, accidental (unintentional), subsequent encounter: Secondary | ICD-10-CM

## 2023-08-09 DIAGNOSIS — T782XXD Anaphylactic shock, unspecified, subsequent encounter: Secondary | ICD-10-CM

## 2023-08-15 ENCOUNTER — Ambulatory Visit (INDEPENDENT_AMBULATORY_CARE_PROVIDER_SITE_OTHER): Payer: Medicare Other | Admitting: *Deleted

## 2023-08-15 DIAGNOSIS — T63481D Toxic effect of venom of other arthropod, accidental (unintentional), subsequent encounter: Secondary | ICD-10-CM | POA: Diagnosis not present

## 2023-08-15 DIAGNOSIS — T782XXD Anaphylactic shock, unspecified, subsequent encounter: Secondary | ICD-10-CM | POA: Diagnosis not present

## 2023-08-22 ENCOUNTER — Ambulatory Visit (INDEPENDENT_AMBULATORY_CARE_PROVIDER_SITE_OTHER): Payer: Medicare Other

## 2023-08-22 DIAGNOSIS — T782XXD Anaphylactic shock, unspecified, subsequent encounter: Secondary | ICD-10-CM

## 2023-08-22 DIAGNOSIS — T63481D Toxic effect of venom of other arthropod, accidental (unintentional), subsequent encounter: Secondary | ICD-10-CM

## 2023-08-27 DIAGNOSIS — Z6823 Body mass index (BMI) 23.0-23.9, adult: Secondary | ICD-10-CM | POA: Diagnosis not present

## 2023-08-27 DIAGNOSIS — G4700 Insomnia, unspecified: Secondary | ICD-10-CM | POA: Diagnosis not present

## 2023-08-27 DIAGNOSIS — I7300 Raynaud's syndrome without gangrene: Secondary | ICD-10-CM | POA: Diagnosis not present

## 2023-08-27 DIAGNOSIS — I10 Essential (primary) hypertension: Secondary | ICD-10-CM | POA: Diagnosis not present

## 2023-08-29 ENCOUNTER — Ambulatory Visit (INDEPENDENT_AMBULATORY_CARE_PROVIDER_SITE_OTHER): Payer: Medicare Other

## 2023-08-29 DIAGNOSIS — T63481D Toxic effect of venom of other arthropod, accidental (unintentional), subsequent encounter: Secondary | ICD-10-CM | POA: Diagnosis not present

## 2023-08-29 DIAGNOSIS — T782XXD Anaphylactic shock, unspecified, subsequent encounter: Secondary | ICD-10-CM

## 2023-08-31 ENCOUNTER — Other Ambulatory Visit: Payer: Self-pay | Admitting: Allergy and Immunology

## 2023-09-05 ENCOUNTER — Ambulatory Visit (INDEPENDENT_AMBULATORY_CARE_PROVIDER_SITE_OTHER): Payer: Medicare Other | Admitting: *Deleted

## 2023-09-05 DIAGNOSIS — T782XXD Anaphylactic shock, unspecified, subsequent encounter: Secondary | ICD-10-CM

## 2023-09-05 DIAGNOSIS — T63481D Toxic effect of venom of other arthropod, accidental (unintentional), subsequent encounter: Secondary | ICD-10-CM

## 2023-09-12 ENCOUNTER — Other Ambulatory Visit: Payer: Self-pay

## 2023-09-12 ENCOUNTER — Ambulatory Visit (INDEPENDENT_AMBULATORY_CARE_PROVIDER_SITE_OTHER): Payer: Self-pay

## 2023-09-12 DIAGNOSIS — T782XXD Anaphylactic shock, unspecified, subsequent encounter: Secondary | ICD-10-CM | POA: Diagnosis not present

## 2023-09-12 DIAGNOSIS — T63481D Toxic effect of venom of other arthropod, accidental (unintentional), subsequent encounter: Secondary | ICD-10-CM

## 2023-09-12 MED ORDER — EPINEPHRINE 0.3 MG/0.3ML IJ SOAJ: INTRAMUSCULAR | 3 refills | Status: DC

## 2023-09-18 ENCOUNTER — Ambulatory Visit (INDEPENDENT_AMBULATORY_CARE_PROVIDER_SITE_OTHER): Payer: Self-pay

## 2023-09-18 DIAGNOSIS — T63481D Toxic effect of venom of other arthropod, accidental (unintentional), subsequent encounter: Secondary | ICD-10-CM | POA: Diagnosis not present

## 2023-09-18 DIAGNOSIS — T782XXD Anaphylactic shock, unspecified, subsequent encounter: Secondary | ICD-10-CM | POA: Diagnosis not present

## 2023-09-26 DIAGNOSIS — E785 Hyperlipidemia, unspecified: Secondary | ICD-10-CM | POA: Diagnosis not present

## 2023-09-26 DIAGNOSIS — E039 Hypothyroidism, unspecified: Secondary | ICD-10-CM | POA: Diagnosis not present

## 2023-09-26 DIAGNOSIS — M436 Torticollis: Secondary | ICD-10-CM | POA: Diagnosis not present

## 2023-09-26 DIAGNOSIS — Z780 Asymptomatic menopausal state: Secondary | ICD-10-CM | POA: Diagnosis not present

## 2023-09-26 DIAGNOSIS — M545 Low back pain, unspecified: Secondary | ICD-10-CM | POA: Diagnosis not present

## 2023-09-26 DIAGNOSIS — H9313 Tinnitus, bilateral: Secondary | ICD-10-CM | POA: Diagnosis not present

## 2023-09-26 DIAGNOSIS — G8929 Other chronic pain: Secondary | ICD-10-CM | POA: Diagnosis not present

## 2023-09-27 ENCOUNTER — Ambulatory Visit (INDEPENDENT_AMBULATORY_CARE_PROVIDER_SITE_OTHER): Payer: Medicare Other | Admitting: *Deleted

## 2023-09-27 DIAGNOSIS — T63481D Toxic effect of venom of other arthropod, accidental (unintentional), subsequent encounter: Secondary | ICD-10-CM | POA: Diagnosis not present

## 2023-09-27 DIAGNOSIS — T782XXD Anaphylactic shock, unspecified, subsequent encounter: Secondary | ICD-10-CM | POA: Diagnosis not present

## 2023-09-29 ENCOUNTER — Other Ambulatory Visit: Payer: Self-pay | Admitting: Allergy and Immunology

## 2023-10-01 DIAGNOSIS — S9031XA Contusion of right foot, initial encounter: Secondary | ICD-10-CM | POA: Diagnosis not present

## 2023-10-01 DIAGNOSIS — R519 Headache, unspecified: Secondary | ICD-10-CM | POA: Diagnosis not present

## 2023-10-03 ENCOUNTER — Ambulatory Visit (INDEPENDENT_AMBULATORY_CARE_PROVIDER_SITE_OTHER): Payer: Medicare Other | Admitting: *Deleted

## 2023-10-03 DIAGNOSIS — T782XXD Anaphylactic shock, unspecified, subsequent encounter: Secondary | ICD-10-CM

## 2023-10-03 DIAGNOSIS — T63481D Toxic effect of venom of other arthropod, accidental (unintentional), subsequent encounter: Secondary | ICD-10-CM | POA: Diagnosis not present

## 2023-10-05 DIAGNOSIS — Z0000 Encounter for general adult medical examination without abnormal findings: Secondary | ICD-10-CM | POA: Diagnosis not present

## 2023-10-05 DIAGNOSIS — Z9181 History of falling: Secondary | ICD-10-CM | POA: Diagnosis not present

## 2023-10-10 ENCOUNTER — Ambulatory Visit (INDEPENDENT_AMBULATORY_CARE_PROVIDER_SITE_OTHER): Payer: Medicare Other | Admitting: *Deleted

## 2023-10-10 DIAGNOSIS — T782XXD Anaphylactic shock, unspecified, subsequent encounter: Secondary | ICD-10-CM

## 2023-10-10 DIAGNOSIS — T63481D Toxic effect of venom of other arthropod, accidental (unintentional), subsequent encounter: Secondary | ICD-10-CM | POA: Diagnosis not present

## 2023-10-16 ENCOUNTER — Ambulatory Visit (INDEPENDENT_AMBULATORY_CARE_PROVIDER_SITE_OTHER): Payer: Medicare Other

## 2023-10-16 DIAGNOSIS — T782XXD Anaphylactic shock, unspecified, subsequent encounter: Secondary | ICD-10-CM

## 2023-10-16 DIAGNOSIS — T63481D Toxic effect of venom of other arthropod, accidental (unintentional), subsequent encounter: Secondary | ICD-10-CM

## 2023-10-17 ENCOUNTER — Ambulatory Visit: Payer: Medicare Other

## 2023-10-23 DIAGNOSIS — G4700 Insomnia, unspecified: Secondary | ICD-10-CM | POA: Diagnosis not present

## 2023-10-23 DIAGNOSIS — Z6823 Body mass index (BMI) 23.0-23.9, adult: Secondary | ICD-10-CM | POA: Diagnosis not present

## 2023-10-23 DIAGNOSIS — I7300 Raynaud's syndrome without gangrene: Secondary | ICD-10-CM | POA: Diagnosis not present

## 2023-10-23 DIAGNOSIS — K219 Gastro-esophageal reflux disease without esophagitis: Secondary | ICD-10-CM | POA: Diagnosis not present

## 2023-10-23 DIAGNOSIS — Z889 Allergy status to unspecified drugs, medicaments and biological substances status: Secondary | ICD-10-CM | POA: Diagnosis not present

## 2023-10-24 ENCOUNTER — Ambulatory Visit (INDEPENDENT_AMBULATORY_CARE_PROVIDER_SITE_OTHER): Payer: Medicare Other | Admitting: *Deleted

## 2023-10-24 DIAGNOSIS — T63481D Toxic effect of venom of other arthropod, accidental (unintentional), subsequent encounter: Secondary | ICD-10-CM | POA: Diagnosis not present

## 2023-10-24 DIAGNOSIS — T782XXD Anaphylactic shock, unspecified, subsequent encounter: Secondary | ICD-10-CM | POA: Diagnosis not present

## 2023-10-25 ENCOUNTER — Other Ambulatory Visit: Payer: Self-pay | Admitting: Allergy and Immunology

## 2023-10-31 ENCOUNTER — Ambulatory Visit (INDEPENDENT_AMBULATORY_CARE_PROVIDER_SITE_OTHER): Payer: Medicare Other | Admitting: Allergy and Immunology

## 2023-10-31 ENCOUNTER — Ambulatory Visit: Payer: Medicare Other

## 2023-10-31 VITALS — BP 124/82 | HR 85 | Resp 16

## 2023-10-31 DIAGNOSIS — T782XXD Anaphylactic shock, unspecified, subsequent encounter: Secondary | ICD-10-CM

## 2023-10-31 DIAGNOSIS — T63481D Toxic effect of venom of other arthropod, accidental (unintentional), subsequent encounter: Secondary | ICD-10-CM | POA: Diagnosis not present

## 2023-10-31 DIAGNOSIS — T7840XD Allergy, unspecified, subsequent encounter: Secondary | ICD-10-CM | POA: Diagnosis not present

## 2023-10-31 DIAGNOSIS — K219 Gastro-esophageal reflux disease without esophagitis: Secondary | ICD-10-CM | POA: Diagnosis not present

## 2023-10-31 DIAGNOSIS — J3089 Other allergic rhinitis: Secondary | ICD-10-CM

## 2023-10-31 NOTE — Patient Instructions (Addendum)
  1.  DECREASE Claritin 10 mg  - 1 tablet 1 times per day  2.  DECREASE Famotidine 20 mg - 1 tablet 1 time per day  3.  Omeprazole 40 mg - 1 tablet 2 times per day  4.  Montelukast 10 mg - 1 tablet 1 time per day  5.  Flonase - 1-2 sprays each nostril 1 time per day  6.  Epi-Pen if needed.   7.  Albuterol HFA - 2 inhalations every 4-6 hours if needed  8.  Avoid mammal consumption, venom exposure  9. Epi-pen, benadryl, MD/ER evaluation for allergic reaction  10. Continue immunotherapy for venom allergy  11. Return to clinic 6 months or earlier if problem. Taper medications???

## 2023-10-31 NOTE — Progress Notes (Signed)
[place] - [place] - [place] - [place] - [place]   Follow-up Note  Referring Provider: Lucianne Lei, MD Primary Provider: Lucianne Lei, MD Date of Office Visit: 10/31/2023  Subjective:   Allison Zavala (DOB: 09/04/1946) is a 77 y.o. female who [facility] in re-evaluation of the following:  HPI: Allison Zavala returns to this clinic in evaluation of allergic reaction with pruritus and urticaria, allergic rhinitis, LPR, hymenoptera venom hypersensitivity state, possible alpha gal syndrome.  I last saw her in this clinic for September 2024.  She has really done well with no allergic reactions while avoiding mammal consumption and using an H1 and H2 receptor blocker and a leukotriene modifier on a consistent basis.  She has had very little problems with her upper airways while using Flonase.  She has not required a systemic steroid or antibiotic for any type of airway issue.    She has not been stung by hymenoptera.  She continues with immunotherapy directed against venom allergy.  Her reflux is under excellent control while using omeprazole twice a day and as noted above also using famotidine.  She does not receive the flu vaccine nor she received the RSV vaccine.  At some point in the past she has received the shingles vaccine and a pneumonia vaccine.  Allergies as of 10/31/2023       Reactions   Bee Venom Anaphylaxis, Hives, Itching, Shortness Of Breath, Swelling   Azithromycin Hives   Cetirizine    Demerol [meperidine Hcl] Nausea And Vomiting        Medication List    albuterol 108 (90 Base) MCG/ACT inhaler Commonly known as: VENTOLIN HFA INHALE 2 PUFFS INTO THE LUNGS EVERY 4 TO 6 HOURS AS NEEDED FOR COUGH OR WHEEZE   APPLE CIDER VINEGAR PO Take by mouth daily.   atorvastatin 80 MG tablet Commonly known as: LIPITOR Take 80 mg by mouth daily.   EPINEPHrine 0.3 mg/0.3 mL Soaj injection Commonly known as: EPI-PEN Use as  directed for life-threatening allergic reaction.   estradiol 0.5 MG tablet Commonly known as: ESTRACE Take 0.5 mg by mouth daily.   famotidine 40 MG tablet Commonly known as: PEPCID Take 40 mg by mouth daily.   fluticasone 50 MCG/ACT nasal spray Commonly known as: FLONASE SHAKE LIQUID AND USE 1 TO 2 SPRAYS IN EACH NOSTRIL DAILY   levothyroxine 75 MCG tablet Commonly known as: SYNTHROID Take 75 mcg by mouth daily before breakfast.   loratadine 10 MG tablet Commonly known as: CLARITIN Take 10 mg by mouth 2 (two) times daily.   montelukast 10 MG tablet Commonly known as: SINGULAIR Take one tablet by mouth once daily at bedtime.   MULTIVITAMIN PO Take by mouth daily.   nitroGLYCERIN 0.4 MG SL tablet Commonly known as: NITROSTAT Place 0.4 mg under the tongue every 5 (five) minutes as needed for chest pain.   omeprazole 20 MG capsule Commonly known as: PRILOSEC Take 20 mg by mouth daily.   SALMON OIL PO Take by mouth daily.   sertraline 100 MG tablet Commonly known as: ZOLOFT Take 100 mg by mouth daily.   VITAMIN C PO Take by mouth daily.   Vitamin D (Ergocalciferol) 1.25 MG (50000 UNIT) Caps capsule Commonly known as: DRISDOL Take 50,000 Units by mouth every 7 (seven) days.   zolpidem 10 MG tablet Commonly known as: AMBIEN Take 5-10 mg by mouth at bedtime.    Past Medical History:  Diagnosis Date  Allergic rhinosinusitis    Anxiety disorder    BMI 23.0-23.9, adult    Dyslipidemia    Fatigue    Gingivostomatitis    High cholesterol    History of cardiac murmur    Hot flash, menopausal    Hymenoptera allergy    Hypertension    Hypothyroidism    Hypothyroidism, adult    Insertional Achilles tendinopathy 04/13/2021   Insomnia    Multiple allergies    Pernio, subsequent encounter 12/13/2017   Post-menopausal atrophic vaginitis    Raynaud phenomenon    Raynaud's syndrome without gangrene 12/13/2017   Sacrococcygeal pain    SOB (shortness of breath)  on exertion    Vitamin D deficiency     Past Surgical History:  Procedure Laterality Date   ABDOMINAL HYSTERECTOMY     ADENOIDECTOMY     LAPAROSCOPY     X 3   OTHER SURGICAL HISTORY     Pelvic repairs abdominal X 2   REFRACTIVE SURGERY     TONSILLECTOMY      Review of systems negative except as noted in HPI / PMHx or noted below:  Review of Systems  Constitutional: Negative.   HENT: Negative.    Eyes: Negative.   Respiratory: Negative.    Cardiovascular: Negative.   Gastrointestinal: Negative.   Genitourinary: Negative.   Musculoskeletal: Negative.   Skin: Negative.   Neurological: Negative.   Endo/Heme/Allergies: Negative.   Psychiatric/Behavioral: Negative.       Objective:   Vitals:   11/01/23 0835  BP: 124/82  Pulse: 85  Resp: 16  SpO2: 99%          Physical Exam Constitutional:      Appearance: She is not diaphoretic.  HENT:     Head: Normocephalic.     Right Ear: Tympanic membrane, ear canal and external ear normal.     Left Ear: Tympanic membrane, ear canal and external ear normal.     Nose: Nose normal. No mucosal edema or rhinorrhea.     Mouth/Throat:     Pharynx: Uvula midline. No oropharyngeal exudate.  Eyes:     Conjunctiva/sclera: Conjunctivae normal.  Neck:     Thyroid: No thyromegaly.     Trachea: Trachea normal. No tracheal tenderness or tracheal deviation.  Cardiovascular:     Rate and Rhythm: Normal rate and regular rhythm.     Heart sounds: Normal heart sounds, S1 normal and S2 normal. No murmur heard. Pulmonary:     Effort: No respiratory distress.     Breath sounds: Normal breath sounds. No stridor. No wheezing or rales.  Lymphadenopathy:     Head:     Right side of head: No tonsillar adenopathy.     Left side of head: No tonsillar adenopathy.     Cervical: No cervical adenopathy.  Skin:    Findings: No erythema or rash.     Nails: There is no clubbing.  Neurological:     Mental Status: She is alert.     Diagnostics:  none  Assessment and Plan:   1. Anaphylaxis due to hymenoptera venom, accidental or unintentional, subsequent encounter   2. Allergic reaction, subsequent encounter   3. Perennial allergic rhinitis   4. LPRD (laryngopharyngeal reflux disease)    1.  DECREASE Claritin 10 mg  - 1 tablet 1 times per day  2.  DECREASE Famotidine 20 mg - 1 tablet 1 time per day  2.  Omeprazole 40 mg - 1 tablet 2 times per day  3.  Montelukast 10 mg - 1 tablet 1 time per day  4.  Flonase - 1-2 sprays each nostril 1 time per day  5.  Epi-Pen if needed.   6.  Albuterol HFA - 2 inhalations every 4-6 hours if needed  7.  Avoid mammal consumption, venom exposure  8. Epi-pen, benadryl, MD/ER evaluation for allergic reaction  9. Continue immunotherapy for venom allergy  10. Return to clinic 6 months or earlier if problem. Taper medications???  Allison Zavala will now utilize less medications that she did previously and hopefully she will continue to do well regarding her multiple issues being addressed in this clinic.  Will decrease her Claritin and famotidine to 1 time per day while she maintains therapy directed against reflux and her airway issue and continues on immunotherapy for her venom hypersensitivity and continues to avoid mammal consumption.  I will see her back in this clinic in 6 months.  Allison Schimke, MD Allergy / Immunology [facility]

## 2023-11-05 ENCOUNTER — Encounter: Payer: Self-pay | Admitting: Allergy and Immunology

## 2023-11-07 ENCOUNTER — Ambulatory Visit (INDEPENDENT_AMBULATORY_CARE_PROVIDER_SITE_OTHER): Payer: Medicare Other | Admitting: *Deleted

## 2023-11-07 DIAGNOSIS — T63481D Toxic effect of venom of other arthropod, accidental (unintentional), subsequent encounter: Secondary | ICD-10-CM | POA: Diagnosis not present

## 2023-11-07 DIAGNOSIS — T782XXD Anaphylactic shock, unspecified, subsequent encounter: Secondary | ICD-10-CM

## 2023-11-22 ENCOUNTER — Ambulatory Visit (INDEPENDENT_AMBULATORY_CARE_PROVIDER_SITE_OTHER): Payer: Medicare Other | Admitting: *Deleted

## 2023-11-22 DIAGNOSIS — T782XXD Anaphylactic shock, unspecified, subsequent encounter: Secondary | ICD-10-CM

## 2023-11-22 DIAGNOSIS — T63481D Toxic effect of venom of other arthropod, accidental (unintentional), subsequent encounter: Secondary | ICD-10-CM | POA: Diagnosis not present

## 2023-11-23 DIAGNOSIS — S63591A Other specified sprain of right wrist, initial encounter: Secondary | ICD-10-CM | POA: Diagnosis not present

## 2023-11-25 ENCOUNTER — Other Ambulatory Visit: Payer: Self-pay | Admitting: Allergy and Immunology

## 2023-11-28 ENCOUNTER — Ambulatory Visit (INDEPENDENT_AMBULATORY_CARE_PROVIDER_SITE_OTHER): Payer: Medicare Other | Admitting: *Deleted

## 2023-11-28 DIAGNOSIS — T782XXD Anaphylactic shock, unspecified, subsequent encounter: Secondary | ICD-10-CM

## 2023-11-28 DIAGNOSIS — T63481D Toxic effect of venom of other arthropod, accidental (unintentional), subsequent encounter: Secondary | ICD-10-CM | POA: Diagnosis not present

## 2023-12-03 DIAGNOSIS — G4700 Insomnia, unspecified: Secondary | ICD-10-CM | POA: Diagnosis not present

## 2023-12-03 DIAGNOSIS — I10 Essential (primary) hypertension: Secondary | ICD-10-CM | POA: Diagnosis not present

## 2023-12-03 DIAGNOSIS — Z6823 Body mass index (BMI) 23.0-23.9, adult: Secondary | ICD-10-CM | POA: Diagnosis not present

## 2023-12-03 DIAGNOSIS — H9313 Tinnitus, bilateral: Secondary | ICD-10-CM | POA: Diagnosis not present

## 2023-12-03 DIAGNOSIS — E559 Vitamin D deficiency, unspecified: Secondary | ICD-10-CM | POA: Diagnosis not present

## 2023-12-03 DIAGNOSIS — I7300 Raynaud's syndrome without gangrene: Secondary | ICD-10-CM | POA: Diagnosis not present

## 2023-12-05 ENCOUNTER — Ambulatory Visit (INDEPENDENT_AMBULATORY_CARE_PROVIDER_SITE_OTHER): Payer: Self-pay | Admitting: *Deleted

## 2023-12-05 DIAGNOSIS — T782XXD Anaphylactic shock, unspecified, subsequent encounter: Secondary | ICD-10-CM

## 2023-12-05 DIAGNOSIS — T63481D Toxic effect of venom of other arthropod, accidental (unintentional), subsequent encounter: Secondary | ICD-10-CM

## 2023-12-07 DIAGNOSIS — S63591A Other specified sprain of right wrist, initial encounter: Secondary | ICD-10-CM | POA: Diagnosis not present

## 2023-12-13 ENCOUNTER — Ambulatory Visit (INDEPENDENT_AMBULATORY_CARE_PROVIDER_SITE_OTHER): Payer: Medicare Other | Admitting: *Deleted

## 2023-12-13 DIAGNOSIS — T782XXD Anaphylactic shock, unspecified, subsequent encounter: Secondary | ICD-10-CM

## 2023-12-13 DIAGNOSIS — T63481D Toxic effect of venom of other arthropod, accidental (unintentional), subsequent encounter: Secondary | ICD-10-CM

## 2023-12-18 DIAGNOSIS — H35373 Puckering of macula, bilateral: Secondary | ICD-10-CM | POA: Diagnosis not present

## 2023-12-18 DIAGNOSIS — F0781 Postconcussional syndrome: Secondary | ICD-10-CM | POA: Diagnosis not present

## 2023-12-19 ENCOUNTER — Ambulatory Visit (INDEPENDENT_AMBULATORY_CARE_PROVIDER_SITE_OTHER): Payer: Self-pay

## 2023-12-19 DIAGNOSIS — T782XXD Anaphylactic shock, unspecified, subsequent encounter: Secondary | ICD-10-CM | POA: Diagnosis not present

## 2023-12-19 DIAGNOSIS — T63481D Toxic effect of venom of other arthropod, accidental (unintentional), subsequent encounter: Secondary | ICD-10-CM | POA: Diagnosis not present

## 2023-12-21 DIAGNOSIS — S63591A Other specified sprain of right wrist, initial encounter: Secondary | ICD-10-CM | POA: Diagnosis not present

## 2023-12-26 ENCOUNTER — Ambulatory Visit (INDEPENDENT_AMBULATORY_CARE_PROVIDER_SITE_OTHER): Payer: Self-pay

## 2023-12-26 DIAGNOSIS — T63481D Toxic effect of venom of other arthropod, accidental (unintentional), subsequent encounter: Secondary | ICD-10-CM | POA: Diagnosis not present

## 2023-12-26 DIAGNOSIS — T782XXD Anaphylactic shock, unspecified, subsequent encounter: Secondary | ICD-10-CM | POA: Diagnosis not present

## 2024-01-07 DIAGNOSIS — H9313 Tinnitus, bilateral: Secondary | ICD-10-CM | POA: Diagnosis not present

## 2024-01-07 DIAGNOSIS — M436 Torticollis: Secondary | ICD-10-CM | POA: Diagnosis not present

## 2024-01-07 DIAGNOSIS — E039 Hypothyroidism, unspecified: Secondary | ICD-10-CM | POA: Diagnosis not present

## 2024-01-07 DIAGNOSIS — M25539 Pain in unspecified wrist: Secondary | ICD-10-CM | POA: Diagnosis not present

## 2024-01-07 DIAGNOSIS — Z780 Asymptomatic menopausal state: Secondary | ICD-10-CM | POA: Diagnosis not present

## 2024-01-07 DIAGNOSIS — E785 Hyperlipidemia, unspecified: Secondary | ICD-10-CM | POA: Diagnosis not present

## 2024-01-07 DIAGNOSIS — J019 Acute sinusitis, unspecified: Secondary | ICD-10-CM | POA: Diagnosis not present

## 2024-01-08 ENCOUNTER — Ambulatory Visit (INDEPENDENT_AMBULATORY_CARE_PROVIDER_SITE_OTHER): Payer: Self-pay

## 2024-01-08 DIAGNOSIS — J309 Allergic rhinitis, unspecified: Secondary | ICD-10-CM

## 2024-01-08 DIAGNOSIS — T63481D Toxic effect of venom of other arthropod, accidental (unintentional), subsequent encounter: Secondary | ICD-10-CM | POA: Diagnosis not present

## 2024-01-16 ENCOUNTER — Ambulatory Visit (INDEPENDENT_AMBULATORY_CARE_PROVIDER_SITE_OTHER): Payer: Self-pay

## 2024-01-16 DIAGNOSIS — J309 Allergic rhinitis, unspecified: Secondary | ICD-10-CM

## 2024-01-16 DIAGNOSIS — T63481D Toxic effect of venom of other arthropod, accidental (unintentional), subsequent encounter: Secondary | ICD-10-CM | POA: Diagnosis not present

## 2024-01-16 DIAGNOSIS — T782XXD Anaphylactic shock, unspecified, subsequent encounter: Secondary | ICD-10-CM

## 2024-01-21 DIAGNOSIS — H6123 Impacted cerumen, bilateral: Secondary | ICD-10-CM | POA: Diagnosis not present

## 2024-01-21 DIAGNOSIS — H9313 Tinnitus, bilateral: Secondary | ICD-10-CM | POA: Diagnosis not present

## 2024-01-21 DIAGNOSIS — H903 Sensorineural hearing loss, bilateral: Secondary | ICD-10-CM | POA: Diagnosis not present

## 2024-01-22 ENCOUNTER — Ambulatory Visit (INDEPENDENT_AMBULATORY_CARE_PROVIDER_SITE_OTHER): Payer: Self-pay

## 2024-01-22 DIAGNOSIS — T782XXD Anaphylactic shock, unspecified, subsequent encounter: Secondary | ICD-10-CM | POA: Diagnosis not present

## 2024-01-22 DIAGNOSIS — T63481D Toxic effect of venom of other arthropod, accidental (unintentional), subsequent encounter: Secondary | ICD-10-CM

## 2024-01-22 DIAGNOSIS — M25531 Pain in right wrist: Secondary | ICD-10-CM | POA: Diagnosis not present

## 2024-01-22 DIAGNOSIS — J309 Allergic rhinitis, unspecified: Secondary | ICD-10-CM | POA: Diagnosis not present

## 2024-01-24 DIAGNOSIS — M19031 Primary osteoarthritis, right wrist: Secondary | ICD-10-CM | POA: Diagnosis not present

## 2024-01-30 ENCOUNTER — Ambulatory Visit (INDEPENDENT_AMBULATORY_CARE_PROVIDER_SITE_OTHER): Payer: Self-pay

## 2024-01-30 DIAGNOSIS — T63481D Toxic effect of venom of other arthropod, accidental (unintentional), subsequent encounter: Secondary | ICD-10-CM

## 2024-01-30 DIAGNOSIS — T782XXD Anaphylactic shock, unspecified, subsequent encounter: Secondary | ICD-10-CM | POA: Diagnosis not present

## 2024-01-30 DIAGNOSIS — S63511A Sprain of carpal joint of right wrist, initial encounter: Secondary | ICD-10-CM | POA: Diagnosis not present

## 2024-01-30 DIAGNOSIS — S63591A Other specified sprain of right wrist, initial encounter: Secondary | ICD-10-CM | POA: Diagnosis not present

## 2024-02-04 DIAGNOSIS — M25539 Pain in unspecified wrist: Secondary | ICD-10-CM | POA: Diagnosis not present

## 2024-02-04 DIAGNOSIS — H9313 Tinnitus, bilateral: Secondary | ICD-10-CM | POA: Diagnosis not present

## 2024-02-04 DIAGNOSIS — S63591A Other specified sprain of right wrist, initial encounter: Secondary | ICD-10-CM | POA: Diagnosis not present

## 2024-02-04 DIAGNOSIS — M436 Torticollis: Secondary | ICD-10-CM | POA: Diagnosis not present

## 2024-02-04 DIAGNOSIS — J019 Acute sinusitis, unspecified: Secondary | ICD-10-CM | POA: Diagnosis not present

## 2024-02-04 DIAGNOSIS — M13831 Other specified arthritis, right wrist: Secondary | ICD-10-CM | POA: Diagnosis not present

## 2024-02-04 DIAGNOSIS — E785 Hyperlipidemia, unspecified: Secondary | ICD-10-CM | POA: Diagnosis not present

## 2024-02-04 DIAGNOSIS — Z780 Asymptomatic menopausal state: Secondary | ICD-10-CM | POA: Diagnosis not present

## 2024-02-04 DIAGNOSIS — G4700 Insomnia, unspecified: Secondary | ICD-10-CM | POA: Diagnosis not present

## 2024-02-04 DIAGNOSIS — E039 Hypothyroidism, unspecified: Secondary | ICD-10-CM | POA: Diagnosis not present

## 2024-02-06 ENCOUNTER — Ambulatory Visit (INDEPENDENT_AMBULATORY_CARE_PROVIDER_SITE_OTHER): Payer: Self-pay

## 2024-02-06 DIAGNOSIS — J309 Allergic rhinitis, unspecified: Secondary | ICD-10-CM | POA: Diagnosis not present

## 2024-02-06 MED ORDER — NEFFY 2 MG/0.1ML NA SOLN: 1.0000 | NASAL | 1 refills | Status: DC | PRN

## 2024-02-08 DIAGNOSIS — Z1231 Encounter for screening mammogram for malignant neoplasm of breast: Secondary | ICD-10-CM | POA: Diagnosis not present

## 2024-02-08 DIAGNOSIS — N959 Unspecified menopausal and perimenopausal disorder: Secondary | ICD-10-CM | POA: Diagnosis not present

## 2024-02-13 ENCOUNTER — Ambulatory Visit (INDEPENDENT_AMBULATORY_CARE_PROVIDER_SITE_OTHER): Payer: Self-pay

## 2024-02-13 DIAGNOSIS — T63481D Toxic effect of venom of other arthropod, accidental (unintentional), subsequent encounter: Secondary | ICD-10-CM | POA: Diagnosis not present

## 2024-02-13 DIAGNOSIS — T782XXD Anaphylactic shock, unspecified, subsequent encounter: Secondary | ICD-10-CM | POA: Diagnosis not present

## 2024-02-13 DIAGNOSIS — Z1231 Encounter for screening mammogram for malignant neoplasm of breast: Secondary | ICD-10-CM | POA: Diagnosis not present

## 2024-02-14 DIAGNOSIS — M8580 Other specified disorders of bone density and structure, unspecified site: Secondary | ICD-10-CM | POA: Diagnosis not present

## 2024-02-14 DIAGNOSIS — Z13820 Encounter for screening for osteoporosis: Secondary | ICD-10-CM | POA: Diagnosis not present

## 2024-02-20 ENCOUNTER — Ambulatory Visit (INDEPENDENT_AMBULATORY_CARE_PROVIDER_SITE_OTHER): Payer: Self-pay

## 2024-02-20 DIAGNOSIS — T63481D Toxic effect of venom of other arthropod, accidental (unintentional), subsequent encounter: Secondary | ICD-10-CM

## 2024-02-20 DIAGNOSIS — T782XXD Anaphylactic shock, unspecified, subsequent encounter: Secondary | ICD-10-CM | POA: Diagnosis not present

## 2024-02-21 DIAGNOSIS — S0992XA Unspecified injury of nose, initial encounter: Secondary | ICD-10-CM | POA: Diagnosis not present

## 2024-02-21 DIAGNOSIS — J342 Deviated nasal septum: Secondary | ICD-10-CM | POA: Diagnosis not present

## 2024-03-04 ENCOUNTER — Ambulatory Visit (INDEPENDENT_AMBULATORY_CARE_PROVIDER_SITE_OTHER): Payer: Self-pay

## 2024-03-04 DIAGNOSIS — T782XXD Anaphylactic shock, unspecified, subsequent encounter: Secondary | ICD-10-CM

## 2024-03-04 DIAGNOSIS — T63481D Toxic effect of venom of other arthropod, accidental (unintentional), subsequent encounter: Secondary | ICD-10-CM

## 2024-03-05 DIAGNOSIS — E559 Vitamin D deficiency, unspecified: Secondary | ICD-10-CM | POA: Diagnosis not present

## 2024-03-05 DIAGNOSIS — I7300 Raynaud's syndrome without gangrene: Secondary | ICD-10-CM | POA: Diagnosis not present

## 2024-03-05 DIAGNOSIS — Z6823 Body mass index (BMI) 23.0-23.9, adult: Secondary | ICD-10-CM | POA: Diagnosis not present

## 2024-03-05 DIAGNOSIS — H9313 Tinnitus, bilateral: Secondary | ICD-10-CM | POA: Diagnosis not present

## 2024-03-27 ENCOUNTER — Ambulatory Visit (INDEPENDENT_AMBULATORY_CARE_PROVIDER_SITE_OTHER): Payer: Self-pay | Admitting: *Deleted

## 2024-03-27 DIAGNOSIS — T782XXD Anaphylactic shock, unspecified, subsequent encounter: Secondary | ICD-10-CM

## 2024-03-27 DIAGNOSIS — T63481D Toxic effect of venom of other arthropod, accidental (unintentional), subsequent encounter: Secondary | ICD-10-CM | POA: Diagnosis not present

## 2024-04-16 ENCOUNTER — Ambulatory Visit (INDEPENDENT_AMBULATORY_CARE_PROVIDER_SITE_OTHER): Payer: Self-pay

## 2024-04-16 DIAGNOSIS — T63481D Toxic effect of venom of other arthropod, accidental (unintentional), subsequent encounter: Secondary | ICD-10-CM

## 2024-04-16 DIAGNOSIS — T782XXD Anaphylactic shock, unspecified, subsequent encounter: Secondary | ICD-10-CM

## 2024-04-21 DIAGNOSIS — Z6823 Body mass index (BMI) 23.0-23.9, adult: Secondary | ICD-10-CM | POA: Diagnosis not present

## 2024-04-21 DIAGNOSIS — M545 Low back pain, unspecified: Secondary | ICD-10-CM | POA: Diagnosis not present

## 2024-04-21 DIAGNOSIS — G4700 Insomnia, unspecified: Secondary | ICD-10-CM | POA: Diagnosis not present

## 2024-04-21 DIAGNOSIS — Z889 Allergy status to unspecified drugs, medicaments and biological substances status: Secondary | ICD-10-CM | POA: Diagnosis not present

## 2024-04-21 DIAGNOSIS — Z8739 Personal history of other diseases of the musculoskeletal system and connective tissue: Secondary | ICD-10-CM | POA: Diagnosis not present

## 2024-04-21 DIAGNOSIS — G8929 Other chronic pain: Secondary | ICD-10-CM | POA: Diagnosis not present

## 2024-04-24 ENCOUNTER — Ambulatory Visit

## 2024-05-13 ENCOUNTER — Ambulatory Visit (INDEPENDENT_AMBULATORY_CARE_PROVIDER_SITE_OTHER)

## 2024-05-13 ENCOUNTER — Other Ambulatory Visit: Payer: Self-pay | Admitting: Allergy and Immunology

## 2024-05-13 DIAGNOSIS — T63481D Toxic effect of venom of other arthropod, accidental (unintentional), subsequent encounter: Secondary | ICD-10-CM

## 2024-05-13 DIAGNOSIS — T782XXD Anaphylactic shock, unspecified, subsequent encounter: Secondary | ICD-10-CM | POA: Diagnosis not present

## 2024-05-20 ENCOUNTER — Telehealth: Payer: Self-pay | Admitting: *Deleted

## 2024-05-20 NOTE — Telephone Encounter (Signed)
 Allison Zavala called today to inform us  that she had hives and swelling after last injection. Stung a week later and had severe allergic reaction. She said she used her Epipen  3 times recently. She wants to know if there is anything she can do to help prevent these reactions.

## 2024-05-21 ENCOUNTER — Other Ambulatory Visit: Payer: Self-pay | Admitting: *Deleted

## 2024-05-21 DIAGNOSIS — T63481D Toxic effect of venom of other arthropod, accidental (unintentional), subsequent encounter: Secondary | ICD-10-CM

## 2024-05-21 DIAGNOSIS — T7840XD Allergy, unspecified, subsequent encounter: Secondary | ICD-10-CM

## 2024-05-21 DIAGNOSIS — Z91018 Allergy to other foods: Secondary | ICD-10-CM

## 2024-05-21 NOTE — Telephone Encounter (Signed)
 Left a message for Allison Zavala to call back. Per Dr. Maurilio: We have to confirm if she has been avoiding mammal meat 100% of the time. If she is still occasionally consuming mammal meat, she needs to COMPLETELY avoid ALL mammal meat. If she has truly been avoiding mammal meat, will will proceed with the tests below.

## 2024-05-21 NOTE — Telephone Encounter (Signed)
 Per Allison Zavala, she states that she is not consuming any mammal meat and hasn't for months. We will proceed with blood and urine tests. Orders have been placed and are up front for her to pick up.

## 2024-05-22 ENCOUNTER — Other Ambulatory Visit: Payer: Self-pay | Admitting: *Deleted

## 2024-05-22 DIAGNOSIS — T7840XD Allergy, unspecified, subsequent encounter: Secondary | ICD-10-CM | POA: Diagnosis not present

## 2024-05-22 DIAGNOSIS — Z91018 Allergy to other foods: Secondary | ICD-10-CM | POA: Diagnosis not present

## 2024-05-22 DIAGNOSIS — T63481D Toxic effect of venom of other arthropod, accidental (unintentional), subsequent encounter: Secondary | ICD-10-CM | POA: Diagnosis not present

## 2024-05-22 DIAGNOSIS — T782XXD Anaphylactic shock, unspecified, subsequent encounter: Secondary | ICD-10-CM | POA: Diagnosis not present

## 2024-05-22 MED ORDER — EPINEPHRINE 0.3 MG/0.3ML IJ SOAJ: INTRAMUSCULAR | 3 refills | Status: AC

## 2024-05-27 LAB — KIT (D816V) DIGITAL PCR

## 2024-05-27 LAB — HYMENOPTERA VENOM ALLERGY PROF

## 2024-05-28 LAB — STATUS REPORT

## 2024-05-29 LAB — HYMENOPTERA VENOM ALLERGY PROF
I001-IgE Honeybee: <0.1 kU/L
I208-IgE Api m 1: <0.1 kU/L
I209-IgE Ves v 5: 5.5 kU/L — AB
I211-IgE Ves v 1: 0.95 kU/L — AB
I211-IgE Ves v 1: 6.4 kU/L — AB
I214-IgE Api m 2: 0.36 kU/L — AB
I215-IgE Api m 3: <0.1 kU/L
I216-IgE Api m 5: 1.92 kU/L — AB
I217-IgE Api m 10: <0.1 kU/L
Reflex Information: 4.91 kU/L — AB
Reflex Information: 8.37 kU/L — AB
Tryptase: 6 ug/L (ref 2.2–13.2)

## 2024-05-29 LAB — ALPHA-GAL PANEL
Allergen Lamb IgE: <0.1 kU/L
Beef IgE: <0.1 kU/L
IgE (Immunoglobulin E), Serum: 167 [IU]/mL (ref 6–495)
O215-IgE Alpha-Gal: 0.38 kU/L — AB
Pork IgE: <0.1 kU/L

## 2024-05-29 LAB — KIT (D816V) DIGITAL PCR

## 2024-05-29 LAB — ALLERGEN COMPONENT COMMENTS

## 2024-05-29 LAB — TRYPTASE

## 2024-06-05 ENCOUNTER — Ambulatory Visit: Payer: Self-pay | Admitting: Allergy and Immunology

## 2024-06-07 LAB — PROSTAGLANDIN D2/CREATININE, U
Creatinine, Urine: 46 mg/dL
Prostaglandin D2, urine: 8.3 pg/mL
Prostaglandin D2/Cr Ratio: 18 ng/g

## 2024-06-17 ENCOUNTER — Ambulatory Visit (INDEPENDENT_AMBULATORY_CARE_PROVIDER_SITE_OTHER): Payer: Self-pay | Admitting: *Deleted

## 2024-06-17 DIAGNOSIS — T63481D Toxic effect of venom of other arthropod, accidental (unintentional), subsequent encounter: Secondary | ICD-10-CM | POA: Diagnosis not present

## 2024-06-17 DIAGNOSIS — T782XXD Anaphylactic shock, unspecified, subsequent encounter: Secondary | ICD-10-CM | POA: Diagnosis not present

## 2024-06-18 DIAGNOSIS — M436 Torticollis: Secondary | ICD-10-CM | POA: Diagnosis not present

## 2024-06-18 DIAGNOSIS — H9313 Tinnitus, bilateral: Secondary | ICD-10-CM | POA: Diagnosis not present

## 2024-06-18 DIAGNOSIS — G4700 Insomnia, unspecified: Secondary | ICD-10-CM | POA: Diagnosis not present

## 2024-06-18 DIAGNOSIS — M25539 Pain in unspecified wrist: Secondary | ICD-10-CM | POA: Diagnosis not present

## 2024-06-18 DIAGNOSIS — E785 Hyperlipidemia, unspecified: Secondary | ICD-10-CM | POA: Diagnosis not present

## 2024-06-18 DIAGNOSIS — Z780 Asymptomatic menopausal state: Secondary | ICD-10-CM | POA: Diagnosis not present

## 2024-06-18 DIAGNOSIS — E039 Hypothyroidism, unspecified: Secondary | ICD-10-CM | POA: Diagnosis not present

## 2024-07-10 ENCOUNTER — Ambulatory Visit (INDEPENDENT_AMBULATORY_CARE_PROVIDER_SITE_OTHER): Admitting: *Deleted

## 2024-07-10 DIAGNOSIS — T782XXD Anaphylactic shock, unspecified, subsequent encounter: Secondary | ICD-10-CM

## 2024-07-10 DIAGNOSIS — T63481D Toxic effect of venom of other arthropod, accidental (unintentional), subsequent encounter: Secondary | ICD-10-CM | POA: Diagnosis not present

## 2024-07-15 ENCOUNTER — Ambulatory Visit

## 2024-08-05 ENCOUNTER — Ambulatory Visit (INDEPENDENT_AMBULATORY_CARE_PROVIDER_SITE_OTHER): Admitting: *Deleted

## 2024-08-05 DIAGNOSIS — T63481D Toxic effect of venom of other arthropod, accidental (unintentional), subsequent encounter: Secondary | ICD-10-CM

## 2024-08-05 DIAGNOSIS — T782XXD Anaphylactic shock, unspecified, subsequent encounter: Secondary | ICD-10-CM

## 2024-08-06 ENCOUNTER — Ambulatory Visit

## 2024-08-18 DIAGNOSIS — E559 Vitamin D deficiency, unspecified: Secondary | ICD-10-CM | POA: Diagnosis not present

## 2024-08-18 DIAGNOSIS — G4700 Insomnia, unspecified: Secondary | ICD-10-CM | POA: Diagnosis not present

## 2024-08-18 DIAGNOSIS — I10 Essential (primary) hypertension: Secondary | ICD-10-CM | POA: Diagnosis not present

## 2024-08-18 DIAGNOSIS — K219 Gastro-esophageal reflux disease without esophagitis: Secondary | ICD-10-CM | POA: Diagnosis not present

## 2024-08-18 DIAGNOSIS — Z6823 Body mass index (BMI) 23.0-23.9, adult: Secondary | ICD-10-CM | POA: Diagnosis not present

## 2024-08-18 DIAGNOSIS — H9313 Tinnitus, bilateral: Secondary | ICD-10-CM | POA: Diagnosis not present

## 2024-08-18 DIAGNOSIS — I7300 Raynaud's syndrome without gangrene: Secondary | ICD-10-CM | POA: Diagnosis not present

## 2024-09-02 ENCOUNTER — Ambulatory Visit

## 2024-09-03 ENCOUNTER — Ambulatory Visit: Payer: Self-pay

## 2024-09-03 DIAGNOSIS — T782XXD Anaphylactic shock, unspecified, subsequent encounter: Secondary | ICD-10-CM

## 2024-09-03 DIAGNOSIS — T63481D Toxic effect of venom of other arthropod, accidental (unintentional), subsequent encounter: Secondary | ICD-10-CM

## 2024-10-01 ENCOUNTER — Ambulatory Visit (INDEPENDENT_AMBULATORY_CARE_PROVIDER_SITE_OTHER)

## 2024-10-01 DIAGNOSIS — T782XXD Anaphylactic shock, unspecified, subsequent encounter: Secondary | ICD-10-CM | POA: Diagnosis not present

## 2024-10-01 DIAGNOSIS — T63481D Toxic effect of venom of other arthropod, accidental (unintentional), subsequent encounter: Secondary | ICD-10-CM | POA: Diagnosis not present

## 2024-10-29 ENCOUNTER — Ambulatory Visit

## 2024-11-11 ENCOUNTER — Ambulatory Visit (INDEPENDENT_AMBULATORY_CARE_PROVIDER_SITE_OTHER): Admitting: *Deleted

## 2024-11-11 DIAGNOSIS — T63481D Toxic effect of venom of other arthropod, accidental (unintentional), subsequent encounter: Secondary | ICD-10-CM

## 2024-11-11 DIAGNOSIS — T782XXD Anaphylactic shock, unspecified, subsequent encounter: Secondary | ICD-10-CM | POA: Diagnosis not present

## 2024-11-16 ENCOUNTER — Other Ambulatory Visit: Payer: Self-pay | Admitting: Allergy and Immunology

## 2024-12-09 ENCOUNTER — Ambulatory Visit

## 2024-12-10 ENCOUNTER — Ambulatory Visit

## 2024-12-10 DIAGNOSIS — T63481D Toxic effect of venom of other arthropod, accidental (unintentional), subsequent encounter: Secondary | ICD-10-CM

## 2024-12-26 ENCOUNTER — Ambulatory Visit (HOSPITAL_BASED_OUTPATIENT_CLINIC_OR_DEPARTMENT_OTHER)
Admission: EM | Admit: 2024-12-26 | Discharge: 2024-12-26 | Disposition: A | Discharge status: Home / Self Care | Source: Home / Self Care

## 2024-12-26 ENCOUNTER — Ambulatory Visit (HOSPITAL_BASED_OUTPATIENT_CLINIC_OR_DEPARTMENT_OTHER): Admit: 2024-12-26 | Admitting: Radiology

## 2024-12-26 ENCOUNTER — Encounter (HOSPITAL_BASED_OUTPATIENT_CLINIC_OR_DEPARTMENT_OTHER): Payer: Self-pay | Admitting: Emergency Medicine

## 2024-12-26 DIAGNOSIS — S40022A Contusion of left upper arm, initial encounter: Secondary | ICD-10-CM

## 2024-12-26 DIAGNOSIS — W19XXXA Unspecified fall, initial encounter: Secondary | ICD-10-CM

## 2024-12-26 DIAGNOSIS — S62609A Fracture of unspecified phalanx of unspecified finger, initial encounter for closed fracture: Secondary | ICD-10-CM

## 2024-12-26 NOTE — ED Triage Notes (Addendum)
 Pt fell on Friday night x 1 week ago on the ice twice her left shoulder went behind her, she also broke her left middle finger. Pt also noticed her clavicle was swollen and she has a knot on  shoulder blade area.

## 2024-12-26 NOTE — Discharge Instructions (Addendum)
 Tylenol  for pain
